# Patient Record
Sex: Male | Born: 1995 | Race: White | Hispanic: No | Marital: Single | Smoking: Never smoker
Health system: Southern US, Community
[De-identification: ages and names within clinical notes are randomized; demographics above are authoritative.]

## PROBLEM LIST (undated history)

## (undated) DIAGNOSIS — S8010XA Contusion of unspecified lower leg, initial encounter: Secondary | ICD-10-CM

## (undated) DIAGNOSIS — K1120 Sialoadenitis, unspecified: Secondary | ICD-10-CM

## (undated) HISTORY — DX: Contusion of unspecified lower leg, initial encounter: S80.10XA

## (undated) HISTORY — DX: Sialoadenitis, unspecified: K11.20

---
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
## 2015-05-31 ENCOUNTER — Encounter: Payer: Self-pay | Admitting: Emergency Medicine

## 2015-05-31 ENCOUNTER — Emergency Department
Admission: EM | Admit: 2015-05-31 | Discharge: 2015-05-31 | Disposition: A | Discharge status: Home / Self Care | Payer: No Typology Code available for payment source | Attending: Emergency Medicine | Admitting: Emergency Medicine

## 2015-05-31 ENCOUNTER — Emergency Department: Payer: No Typology Code available for payment source

## 2015-05-31 DIAGNOSIS — T07XXXA Unspecified multiple injuries, initial encounter: Secondary | ICD-10-CM

## 2015-05-31 DIAGNOSIS — S59902A Unspecified injury of left elbow, initial encounter: Secondary | ICD-10-CM | POA: Insufficient documentation

## 2015-05-31 DIAGNOSIS — Y9389 Activity, other specified: Secondary | ICD-10-CM | POA: Insufficient documentation

## 2015-05-31 DIAGNOSIS — Y998 Other external cause status: Secondary | ICD-10-CM | POA: Diagnosis not present

## 2015-05-31 DIAGNOSIS — S60512A Abrasion of left hand, initial encounter: Secondary | ICD-10-CM | POA: Diagnosis not present

## 2015-05-31 DIAGNOSIS — S80812A Abrasion, left lower leg, initial encounter: Secondary | ICD-10-CM | POA: Insufficient documentation

## 2015-05-31 DIAGNOSIS — Y92410 Unspecified street and highway as the place of occurrence of the external cause: Secondary | ICD-10-CM | POA: Insufficient documentation

## 2015-05-31 DIAGNOSIS — S8392XA Sprain of unspecified site of left knee, initial encounter: Secondary | ICD-10-CM | POA: Insufficient documentation

## 2015-05-31 DIAGNOSIS — S40212A Abrasion of left shoulder, initial encounter: Secondary | ICD-10-CM | POA: Diagnosis not present

## 2015-05-31 DIAGNOSIS — S86912A Strain of unspecified muscle(s) and tendon(s) at lower leg level, left leg, initial encounter: Secondary | ICD-10-CM | POA: Diagnosis not present

## 2015-05-31 DIAGNOSIS — S20412A Abrasion of left back wall of thorax, initial encounter: Secondary | ICD-10-CM | POA: Diagnosis not present

## 2015-05-31 DIAGNOSIS — S79912A Unspecified injury of left hip, initial encounter: Secondary | ICD-10-CM | POA: Insufficient documentation

## 2015-05-31 DIAGNOSIS — S8992XA Unspecified injury of left lower leg, initial encounter: Secondary | ICD-10-CM | POA: Diagnosis present

## 2015-05-31 MED ORDER — ACETAMINOPHEN 500 MG PO TABS
1000.0000 mg | ORAL_TABLET | Freq: Once | ORAL | Status: AC
  Administered 2015-05-31: 1000 mg via ORAL
  Filled 2015-05-31: qty 2

## 2015-05-31 MED ORDER — BACITRACIN ZINC 500 UNIT/GM EX OINT
TOPICAL_OINTMENT | CUTANEOUS | Status: AC
  Administered 2015-05-31: 1 via TOPICAL
  Filled 2015-05-31: qty 0.9

## 2015-05-31 MED ORDER — HYDROCODONE-ACETAMINOPHEN 5-325 MG PO TABS: 1.0000 | ORAL_TABLET | Freq: Four times a day (QID) | ORAL | Status: DC | PRN

## 2015-05-31 MED ORDER — CYCLOBENZAPRINE HCL 5 MG PO TABS: 5.0000 mg | ORAL_TABLET | Freq: Three times a day (TID) | ORAL | Status: DC | PRN

## 2015-05-31 MED ORDER — BACITRACIN ZINC 500 UNIT/GM EX OINT
TOPICAL_OINTMENT | Freq: Two times a day (BID) | CUTANEOUS | Status: DC
  Administered 2015-05-31: 1 via TOPICAL

## 2015-05-31 MED ORDER — BACITRACIN ZINC 500 UNIT/GM EX OINT
TOPICAL_OINTMENT | CUTANEOUS | Status: AC
  Filled 2015-05-31: qty 0.9

## 2015-05-31 MED ORDER — HYDROCODONE-ACETAMINOPHEN 5-325 MG PO TABS
1.0000 | ORAL_TABLET | Freq: Once | ORAL | Status: AC
  Administered 2015-05-31: 1 via ORAL
  Filled 2015-05-31: qty 1
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
## 2015-05-31 NOTE — Discharge Instructions (Signed)
Knee Sprain A knee sprain is a tear in the strong bands of tissue that connect the bones (ligaments) of your knee. HOME CARE  Raise (elevate) your injured knee to lessen puffiness (swelling).  To ease pain and puffiness, put ice on the injured area.  Put ice in a plastic bag.  Place a towel between your skin and the bag.  Leave the ice on for 20 minutes, 2-3 times a day.  Only take medicine as told by your doctor.  Do not leave your knee unprotected until pain and stiffness go away (usually 4-6 weeks).  If you have a cast or splint, do not get it wet. If your doctor told you to not take it off, cover it with a plastic bag when you shower or bathe. Do not swim.  Your doctor may have you do exercises to prevent or limit permanent weakness and stiffness. GET HELP RIGHT AWAY IF:   Your cast or splint becomes damaged.  Your pain gets worse.  You have a lot of pain, puffiness, or numbness below the cast or splint. MAKE SURE YOU:   Understand these instructions.  Will watch your condition.  Will get help right away if you are not doing well or get worse.   This information is not intended to replace advice given to you by your health care provider. Make sure you discuss any questions you have with your health care provider.   Document Released: 06/25/2009 Document Revised: 07/12/2013 Document Reviewed: 03/15/2013 Elsevier Interactive Patient Education 2016 [place] the wounds clean, dry, and covered. Take the prescription meds as directed. Follow-up with Dr. Army Melia as needed.

## 2015-06-04 ENCOUNTER — Encounter: Payer: Self-pay | Admitting: Internal Medicine

## 2015-06-05 ENCOUNTER — Encounter: Payer: Self-pay | Admitting: Internal Medicine

## 2015-06-05 ENCOUNTER — Ambulatory Visit (INDEPENDENT_AMBULATORY_CARE_PROVIDER_SITE_OTHER): Payer: 59 | Admitting: Internal Medicine

## 2015-06-05 VITALS — BP 100/60 | HR 68 | Ht 72.0 in | Wt 123.6 lb

## 2015-06-05 DIAGNOSIS — M8480 Other disorders of continuity of bone, unspecified site: Secondary | ICD-10-CM

## 2015-06-05 DIAGNOSIS — S8012XA Contusion of left lower leg, initial encounter: Secondary | ICD-10-CM

## 2015-06-05 DIAGNOSIS — S8010XA Contusion of unspecified lower leg, initial encounter: Secondary | ICD-10-CM | POA: Insufficient documentation

## 2015-06-05 DIAGNOSIS — D169 Benign neoplasm of bone and articular cartilage, unspecified: Secondary | ICD-10-CM

## 2015-06-05 HISTORY — DX: Contusion of unspecified lower leg, initial encounter: S80.10XA
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
## 2015-07-11 ENCOUNTER — Ambulatory Visit (INDEPENDENT_AMBULATORY_CARE_PROVIDER_SITE_OTHER): Payer: 59 | Admitting: Internal Medicine

## 2015-07-11 ENCOUNTER — Encounter: Payer: Self-pay | Admitting: Internal Medicine

## 2015-07-11 VITALS — BP 110/70 | HR 68 | Ht 72.0 in | Wt 124.4 lb

## 2015-07-11 DIAGNOSIS — L03012 Cellulitis of left finger: Secondary | ICD-10-CM | POA: Diagnosis not present

## 2015-07-11 MED ORDER — AMOXICILLIN-POT CLAVULANATE 875-125 MG PO TABS: 1.0000 | ORAL_TABLET | Freq: Two times a day (BID) | ORAL | Status: DC

## 2015-07-11 NOTE — Progress Notes (Signed)
    Date:  07/11/2015   Name:  Andrew Frederick.   DOB:  1996-06-01   MRN:  [id number]   Chief Complaint: Abrasion  3 weeks ago noted swelling around this index finger on the left hand.  He thought there might be a splinter so his mother tried to see if she could remove anything with tweezers.  No foreign body was found.  Thereafter some purulent material drained from the region but it has not healed.  Slightly tender but he can work and is not limited in his activities.   Review of Systems  Constitutional: Negative for fever.  Skin: Positive for wound.  Neurological: Negative for weakness and numbness.  Hematological: Does not bruise/bleed easily.      Patient Active Problem List   Diagnosis Date Noted  . 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 Multiple leg contusions 06/05/2015    Prior to Admission medications   Not on File    No Known Allergies  No past surgical history on file.  Social History  Substance Use Topics  . Smoking status: Never Smoker   . Smokeless tobacco: None  . Alcohol Use: No    Medication list has been reviewed and updated.   Physical Exam  Constitutional: He is oriented to person, place, and time. He appears well-developed. No distress.  HENT:  Head: Normocephalic and atraumatic.  Eyes: Conjunctivae are normal. Right eye exhibits no discharge. Left eye exhibits no discharge. No scleral icterus.  Pulmonary/Chest: Effort normal. No respiratory distress.  Musculoskeletal: Normal range of motion.  Neurological: He is alert and oriented to person, place, and time.  Skin: Skin is warm and dry. No rash noted.   Paronychia of the left index finger -  No drainage or odor but the area is red , slightly swollen, and minimally tender.  The nailbed is lifted from the base of the nail.  Psychiatric: He has a normal mood and affect. His behavior is normal. Thought content normal.    BP 110/70 mmHg  Pulse 68  Ht 6' (1.829 m)  Wt 124 lb 6.4 oz (56.427 kg)  BMI  16.87 kg/m2  Assessment and Plan: 1. Paronychia of finger, left Warm soaks 2-3 times a day Apply Neosporin and a loose dressing or bandaid - amoxicillin-clavulanate (AUGMENTIN) 875-125 MG tablet; Take 1 tablet by mouth 2 (two) times daily.  Dispense: 20 tablet; Refill: 0   Halina Maidens, MD [facility]  07/11/2015

## 2015-07-11 NOTE — Patient Instructions (Signed)
Paronychia °Paronychia is an infection of the skin that surrounds a nail. It usually affects the skin around a fingernail, but it may also occur near a toenail. It often causes pain and swelling around the nail. This condition may come on suddenly or develop over a longer period. In some cases, a collection of pus (abscess) can form near or under the nail. Usually, paronychia is not serious and it clears up with treatment. °CAUSES °This condition may be caused by bacteria or fungi. It is commonly caused by either Streptococcus or Staphylococcus bacteria. The bacteria or fungi often cause the infection by getting into the affected area through an opening in the skin, such as a cut or a hangnail. °RISK FACTORS °This condition is more likely to develop in: °· People who get their hands wet often, such as those who work as dishwashers, bartenders, or nurses. °· People who bite their fingernails or suck their thumbs. °· People who trim their nails too short. °· People who have hangnails or injured fingertips. °· People who get manicures. °· People who have diabetes. °SYMPTOMS °Symptoms of this condition include: °· Redness and swelling of the skin near the nail. °· Tenderness around the nail when you touch the area. °· Pus-filled bumps under the cuticle. The cuticle is the skin at the base or sides of the nail. °· Fluid or pus under the nail. °· Throbbing pain in the area. °DIAGNOSIS °This condition is usually diagnosed with a physical exam. In some cases, a sample of pus may be taken from an abscess to be tested in a lab. This can help to determine what type of bacteria or fungi is causing the condition. °TREATMENT °Treatment for this condition depends on the cause and severity of the condition. If the condition is mild, it may clear up on its own in a few days. Your health care provider may recommend soaking the affected area in warm water a few times a day. When treatment is needed, the options may  include: °· Antibiotic medicine, if the condition is caused by a bacterial infection. °· Antifungal medicine, if the condition is caused by a fungal infection. °· Incision and drainage, if an abscess is present. In this procedure, the health care provider will cut open the abscess so the pus can drain out. °HOME CARE INSTRUCTIONS °· Soak the affected area in warm water if directed to do so by your health care provider. You may be told to do this for 20 minutes, 2-3 times a day. Keep the area dry in between soakings. °· Take medicines only as directed by your health care provider. °· If you were prescribed an antibiotic medicine, finish all of it even if you start to feel better. °· Keep the affected area clean. °· Do not try to drain a fluid-filled bump yourself. °· If you will be washing dishes or performing other tasks that require your hands to get wet, wear rubber gloves. You should also wear gloves if your hands might come in contact with irritating substances, such as cleaners or chemicals. °· Follow your health care provider's instructions about: °¨ Wound care. °¨ Bandage (dressing) changes and removal. °SEEK MEDICAL CARE IF: °· Your symptoms get worse or do not improve with treatment. °· You have a fever or chills. °· You have redness spreading from the affected area. °· You have continued or increased fluid, blood, or pus coming from the affected area. °· Your finger or knuckle becomes swollen or is difficult to move. °  °  This information is not intended to replace advice given to you by your health care provider. Make sure you discuss any questions you have with your health care provider. °  °Document Released: 12/31/2000 Document Revised: 11/21/2014 Document Reviewed: 06/14/2014 °Elsevier Interactive Patient Education ©2016 [employer] ° °

## 2015-09-05 ENCOUNTER — Ambulatory Visit: Payer: 59 | Admitting: Internal Medicine
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
## 2017-02-09 DIAGNOSIS — M25572 Pain in left ankle and joints of left foot: Secondary | ICD-10-CM | POA: Diagnosis not present

## 2017-06-04 ENCOUNTER — Ambulatory Visit: Payer: 59 | Admitting: Internal Medicine

## 2017-06-04 ENCOUNTER — Encounter: Payer: Self-pay | Admitting: Internal Medicine

## 2017-06-04 VITALS — BP 120/64 | HR 69 | Temp 98.3°F | Ht 72.0 in | Wt 126.0 lb

## 2017-06-04 DIAGNOSIS — G44209 Tension-type headache, unspecified, not intractable: Secondary | ICD-10-CM

## 2017-06-04 DIAGNOSIS — K1120 Sialoadenitis, unspecified: Secondary | ICD-10-CM

## 2017-06-04 DIAGNOSIS — M26609 Unspecified temporomandibular joint disorder, unspecified side: Secondary | ICD-10-CM

## 2017-06-04 HISTORY — DX: Sialoadenitis, unspecified: K11.20

## 2017-06-04 MED ORDER — CYCLOBENZAPRINE HCL 10 MG PO TABS: 10.0000 mg | ORAL_TABLET | Freq: Every day | ORAL | 0 refills | Status: DC

## 2017-06-04 MED ORDER — AMOXICILLIN-POT CLAVULANATE 875-125 MG PO TABS: 1.0000 | ORAL_TABLET | Freq: Two times a day (BID) | ORAL | 0 refills | Status: AC

## 2017-06-04 NOTE — Progress Notes (Signed)
Date:  06/04/2017   Name:  Andrew Frederick.   DOB:  [birth year]   MRN:  [id number]   Chief Complaint: Headache (Started happening 2 months ago. They come and go and can last a full month with no relief. Pain is located above eye brows.  ) and Facial Swelling (Started this morning- Jaw is swollen and unsure of why. Not really sore just very swollen. )  Headache   This is a new problem. The current episode started more than 1 month ago. The problem occurs daily. The problem has been unchanged. The pain is located in the frontal region. The pain does not radiate. The quality of the pain is described as aching. Pertinent negatives include no ear pain, fever, nausea, phonophobia, photophobia, sinus pressure, sore throat, visual change, vomiting or weakness. Exacerbated by: work dusty environment. He has tried NSAIDs for the symptoms. The treatment provided significant (but headaches return) relief.   Swelling - noted on left jaw this am.  No trauma or fever.  No sore throat or obvious tooth problem.  He has not been to the dentist in years.  He has no ear pain, sinus drainage, trouble swallowing.  Mouth feels dry on that side.   Review of Systems  Constitutional: Negative for chills, fatigue, fever and unexpected weight change.  HENT: Positive for facial swelling. Negative for ear pain, sinus pressure, sinus pain, sore throat, trouble swallowing and voice change.   Eyes: Negative for photophobia.  Respiratory: Negative for chest tightness, shortness of breath and wheezing.   Cardiovascular: Negative for chest pain, palpitations and leg swelling.  Gastrointestinal: Negative for diarrhea, nausea and vomiting.  Musculoskeletal: Negative for arthralgias and gait problem.  Neurological: Positive for headaches. Negative for weakness.  Psychiatric/Behavioral: Negative for sleep disturbance.    Patient Active Problem List   Diagnosis Date Noted  . 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 Multiple leg  contusions 06/05/2015    Prior to Admission medications   Not on File    No Known Allergies  History reviewed. No pertinent surgical history.  Social History   Tobacco Use  . Smoking status: Never Smoker  . Smokeless tobacco: Never Used  Substance Use Topics  . Alcohol use: No    Alcohol/week: 0.0 oz  . Drug use: No     Medication list has been reviewed and updated.  PHQ 2/9 Scores 06/04/2017  PHQ - 2 Score 0    Physical Exam  Constitutional: He is oriented to person, place, and time. He appears well-developed. No distress.  HENT:  Head: Normocephalic and atraumatic.  Right Ear: Tympanic membrane and ear canal normal.  Left Ear: Tympanic membrane and ear canal normal.  Nose: Right sinus exhibits no maxillary sinus tenderness and no frontal sinus tenderness. Left sinus exhibits no maxillary sinus tenderness and no frontal sinus tenderness.  Mouth/Throat: Abnormal dentition. Dental caries present. No posterior oropharyngeal edema or posterior oropharyngeal erythema.  Neck: Normal range of motion. No thyromegaly present.  Cardiovascular: Normal rate, regular rhythm and normal heart sounds.  Pulmonary/Chest: Effort normal and breath sounds normal. No respiratory distress.  Musculoskeletal: Normal range of motion.  TMJ click with decreased excursion bilaterally  Lymphadenopathy:    He has no cervical adenopathy.  Neurological: He is alert and oriented to person, place, and time.  Skin: Skin is warm and dry. No rash noted.  Soft tissue swelling over the left lower jaw.  Soft, poorly defined, minimally tender, not red or warm  Oral  mucosa clear No saliva expressed from palpation  Psychiatric: He has a normal mood and affect. His behavior is normal. Thought content normal.  Nursing note and vitals reviewed.   BP 120/64   Pulse 69   Temp 98.3 F (36.8 C) (Oral)   Ht 6' (1.829 m)   Wt 126 lb (57.2 kg)   SpO2 98%   BMI 17.09 kg/m   Assessment and Plan: 1. Muscle  tension headache Continue advil as needed - cyclobenzaprine (FLEXERIL) 10 MG tablet; Take 1 tablet (10 mg total) at bedtime by mouth.  Dispense: 30 tablet; Refill: 0  2. TMJ (temporomandibular joint disorder) Likely the source of headache  3. Parotiditis Sour lemon drops to help express possible stone Follow up next week if not improving - amoxicillin-clavulanate (AUGMENTIN) 875-125 MG tablet; Take 1 tablet 2 (two) times daily for 10 days by mouth.  Dispense: 20 tablet; Refill: 0   Meds ordered this encounter  Medications  . amoxicillin-clavulanate (AUGMENTIN) 875-125 MG tablet    Sig: Take 1 tablet 2 (two) times daily for 10 days by mouth.    Dispense:  20 tablet    Refill:  0  . cyclobenzaprine (FLEXERIL) 10 MG tablet    Sig: Take 1 tablet (10 mg total) at bedtime by mouth.    Dispense:  30 tablet    Refill:  0   Pt recommended to see a dentist for routine oral care.  Partially dictated using Editor, commissioning. Any errors are unintentional.  Halina Maidens, MD [facility]  06/04/2017

## 2017-06-04 NOTE — Patient Instructions (Signed)
Claritin 10 mg once a day  Lemon drops -

## 2017-10-29 ENCOUNTER — Other Ambulatory Visit: Payer: Self-pay

## 2017-10-29 ENCOUNTER — Inpatient Hospital Stay
Admission: AD | Admit: 2017-10-29 | Discharge: 2017-11-03 | DRG: 885 | Disposition: A | Discharge status: Home / Self Care | Payer: 59 | Attending: Psychiatry | Admitting: Psychiatry

## 2017-10-29 ENCOUNTER — Encounter: Payer: Self-pay | Admitting: Emergency Medicine

## 2017-10-29 ENCOUNTER — Emergency Department
Admission: EM | Admit: 2017-10-29 | Discharge: 2017-10-29 | Disposition: A | Discharge status: Other Acute Inpatient Hospital | Payer: 59 | Attending: Emergency Medicine | Admitting: Emergency Medicine

## 2017-10-29 DIAGNOSIS — Z634 Disappearance and death of family member: Secondary | ICD-10-CM | POA: Diagnosis not present

## 2017-10-29 DIAGNOSIS — F332 Major depressive disorder, recurrent severe without psychotic features: Secondary | ICD-10-CM | POA: Diagnosis present

## 2017-10-29 DIAGNOSIS — R45851 Suicidal ideations: Secondary | ICD-10-CM | POA: Diagnosis present

## 2017-10-29 DIAGNOSIS — G4700 Insomnia, unspecified: Secondary | ICD-10-CM | POA: Diagnosis present

## 2017-10-29 DIAGNOSIS — Z5181 Encounter for therapeutic drug level monitoring: Secondary | ICD-10-CM | POA: Diagnosis not present

## 2017-10-29 DIAGNOSIS — Z79899 Other long term (current) drug therapy: Secondary | ICD-10-CM

## 2017-10-29 LAB — COMPREHENSIVE METABOLIC PANEL
ALBUMIN: 5 g/dL (ref 3.5–5.0)
ALT: 17 U/L (ref 17–63)
ANION GAP: 6 (ref 5–15)
AST: 24 U/L (ref 15–41)
Alkaline Phosphatase: 118 U/L (ref 38–126)
BUN: 14 mg/dL (ref 6–20)
CHLORIDE: 107 mmol/L (ref 101–111)
CO2: 29 mmol/L (ref 22–32)
Calcium: 9.6 mg/dL (ref 8.9–10.3)
Creatinine, Ser: 1.01 mg/dL (ref 0.61–1.24)
GFR calc Af Amer: >60 mL/min (ref >60)
GFR calc non Af Amer: >60 mL/min (ref >60)
GLUCOSE: 86 mg/dL (ref 65–99)
POTASSIUM: 3.9 mmol/L (ref 3.5–5.1)
SODIUM: 142 mmol/L (ref 135–145)
TOTAL PROTEIN: 8.4 g/dL — AB (ref 6.5–8.1)
Total Bilirubin: 0.6 mg/dL (ref 0.3–1.2)

## 2017-10-29 LAB — CBC
HEMATOCRIT: 44.7 % (ref 40.0–52.0)
Hemoglobin: 15.4 g/dL (ref 13.0–18.0)
MCH: 29.8 pg (ref 26.0–34.0)
MCHC: 34.4 g/dL (ref 32.0–36.0)
MCV: 86.5 fL (ref 80.0–100.0)
Platelets: 260 10*3/uL (ref 150–440)
RBC: 5.17 MIL/uL (ref 4.40–5.90)
RDW: 13.6 % (ref 11.5–14.5)
WBC: 5.2 10*3/uL (ref 3.8–10.6)

## 2017-10-29 LAB — URINE DRUG SCREEN, QUALITATIVE (ARMC ONLY)
AMPHETAMINES, UR SCREEN: NOT DETECTED
BENZODIAZEPINE, UR SCRN: NOT DETECTED
Barbiturates, Ur Screen: NOT DETECTED
Cannabinoid 50 Ng, Ur ~~LOC~~: NOT DETECTED
Cocaine Metabolite,Ur ~~LOC~~: NOT DETECTED
MDMA (ECSTASY) UR SCREEN: NOT DETECTED
METHADONE SCREEN, URINE: NOT DETECTED
OPIATE, UR SCREEN: NOT DETECTED
Phencyclidine (PCP) Ur S: NOT DETECTED
Tricyclic, Ur Screen: NOT DETECTED

## 2017-10-29 LAB — ACETAMINOPHEN LEVEL

## 2017-10-29 LAB — ETHANOL: Alcohol, Ethyl (B): <10 mg/dL (ref <10)

## 2017-10-29 LAB — SALICYLATE LEVEL: Salicylate Lvl: <7 mg/dL (ref 2.8–30.0)

## 2017-10-29 MED ORDER — ALUM & MAG HYDROXIDE-SIMETH 200-200-20 MG/5ML PO SUSP: 30.0000 mL | ORAL | Status: DC | PRN

## 2017-10-29 MED ORDER — ACETAMINOPHEN 325 MG PO TABS: 650.0000 mg | ORAL_TABLET | Freq: Four times a day (QID) | ORAL | Status: DC | PRN

## 2017-10-29 MED ORDER — TRAZODONE HCL 100 MG PO TABS
100.0000 mg | ORAL_TABLET | Freq: Every evening | ORAL | Status: DC | PRN
  Administered 2017-10-29 – 2017-11-02 (×2): 100 mg via ORAL
  Filled 2017-10-29 (×2): qty 1

## 2017-10-29 MED ORDER — HYDROXYZINE HCL 50 MG PO TABS
50.0000 mg | ORAL_TABLET | Freq: Three times a day (TID) | ORAL | Status: DC | PRN
  Administered 2017-10-29 – 2017-11-02 (×2): 50 mg via ORAL
  Filled 2017-10-29 (×2): qty 1

## 2017-10-29 MED ORDER — FLUOXETINE HCL 20 MG PO CAPS
20.0000 mg | ORAL_CAPSULE | Freq: Every day | ORAL | Status: DC
  Administered 2017-10-30 – 2017-11-03 (×5): 20 mg via ORAL
  Filled 2017-10-29: qty 2
  Filled 2017-10-29 (×5): qty 1

## 2017-10-29 MED ORDER — MAGNESIUM HYDROXIDE 400 MG/5ML PO SUSP: 30.0000 mL | Freq: Every day | ORAL | Status: DC | PRN

## 2017-10-29 MED ORDER — FLUOXETINE HCL 20 MG PO CAPS
20.0000 mg | ORAL_CAPSULE | Freq: Every day | ORAL | Status: DC
  Administered 2017-10-29: 20 mg via ORAL
  Filled 2017-10-29: qty 1

## 2017-10-29 NOTE — ED Notes (Signed)
Dinner tray given to patient at this time.

## 2017-10-29 NOTE — BH Assessment (Signed)
Patient is to be admitted to [facility] by Dr. Weber Cooks.  Attending Physician will be Dr. Wonda Olds.   Patient has been assigned to room 312, by [place] staff is aware of the admission:  Luann, ER Sectary   Dr. Mariea Clonts, ER MD   Amy M., Patient's Nurse   Genella Rife, Patient Access.

## 2017-10-29 NOTE — ED Notes (Signed)
Patient's father here at bedside with patient for visitation.  Both parties calm and cooperative at this time.  Patient advocate present with visitor.

## 2017-10-29 NOTE — ED Notes (Signed)
Visitors changed out for patient.  Visitor is calm and cooperative and given instructions for visitation privileges.

## 2017-10-29 NOTE — ED Notes (Signed)
Father took belongings home per patient.

## 2017-10-29 NOTE — Consult Note (Signed)
[facility] Consult   Reason for Consult: Consult for 22 year old man who came voluntarily to the emergency room seeking help with suicidal ideation Referring Physician: Mariea Clonts Patient Identification: Andrew Frederick. MRN:  [id number] Principal Diagnosis: Severe recurrent major depression without psychotic features [facility]) Diagnosis:   Patient Active Problem List   Diagnosis Date Noted  . Suicidal ideation [R45.851] 10/29/2017  . Severe recurrent major depression without psychotic features ([place]) [F33.2] 10/29/2017  . Muscle tension headache [G44.209] 06/04/2017  . 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 Parotiditis [K11.20] 06/04/2017  . 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 Multiple leg contusions [S80.10XA] 06/05/2015    Total Time spent with patient: 1 hour  Subjective:   Andrew Frederick. is a 22 y.o. male patient admitted with "I lost my fianc".  HPI: Patient interviewed chart reviewed.  22 year old man came to the emergency room voluntarily because he says his mood has been bad and he has been having "bad thoughts".  His fiance died a little over a year ago.  He says he has been feeling bad ever since then but the current episode has been going on at least a week.  Mood feels down all the time.  He says he is having thoughts about killing himself.  When asked about any plans he will mention overdosing although he does not have any medicine he would overdose on.  Patient says he has difficulty sleeping at night waking up very early.  He also says that when he closes his eyes he will see visions of demons which sometimes persist when he opens his eyes.  Appetite is been a little decreased.  He has still been able to go and do his job though.  He denies any alcohol or drug abuse.  He does not note any specific new stressor or reason why symptoms are worse.  Patient is not currently receiving any mental health treatment.  Social history: Lives with  his parents.  Works at a warehouse daytime shift.  Says that when he is not working he comes home and just goes into his bedroom and has very little social life.  He does not offer much detail about the situation with his fiance dying.  Medical history: Patient has been treated for headaches and for minor injuries in the past but has no known ongoing medical problems.  Substance abuse history: Denies any alcohol or drug abuse.  Nothing on the drug screen.  Past Psychiatric History: Patient says he has never been seen by a mental health provider in the past despite saying the symptoms have been present for over a year.  Never seen a counselor or therapist.  Never been in a psychiatric hospital.  Never been on any psychiatric medicine.  No history of suicide attempts in the past.  Risk to Self: Is patient at risk for suicide?: Yes Risk to Others:   Prior Inpatient Therapy:   Prior Outpatient Therapy:    Past Medical History: History reviewed. No pertinent past medical history. History reviewed. No pertinent surgical history. Family History:  Family History  Problem Relation Age of Onset  . Diabetes Father   . Hyperlipidemia Father    Family Psychiatric  History: Does not know of family history Social History:  Social History   Substance and Sexual Activity  Alcohol Use No  . Alcohol/week: 0.0 oz     Social History   Substance and Sexual Activity  Drug Use No    Social History  Socioeconomic History  . Marital status: Single    Spouse name: Not on file  . Number of children: Not on file  . Years of education: Not on file  . Highest education level: Not on file  Occupational History  . Not on file  Social Needs  . Financial resource strain: Not on file  . Food insecurity:    Worry: Not on file    Inability: Not on file  . Transportation needs:    Medical: Not on file    Non-medical: Not on file  Tobacco Use  . Smoking status: Never Smoker  . Smokeless tobacco: Never  Used  Substance and Sexual Activity  . Alcohol use: No    Alcohol/week: 0.0 oz  . Drug use: No  . Sexual activity: Not on file  Lifestyle  . Physical activity:    Days per week: Not on file    Minutes per session: Not on file  . Stress: Not on file  Relationships  . Social connections:    Talks on phone: Not on file    Gets together: Not on file    Attends religious service: Not on file    Active member of club or organization: Not on file    Attends meetings of clubs or organizations: Not on file    Relationship status: Not on file  Other Topics Concern  . Not on file  Social History Narrative  . Not on file   Additional Social History:    Allergies:  No Known Allergies  Labs:  Results for orders placed or performed during the hospital encounter of 10/29/17 (from the past 48 hour(s))  Comprehensive metabolic panel     Status: Abnormal   Collection Time: 10/29/17  9:53 AM  Result Value Ref Range   Sodium 142 135 - 145 mmol/L   Potassium 3.9 3.5 - 5.1 mmol/L   Chloride 107 101 - 111 mmol/L   CO2 29 22 - 32 mmol/L   Glucose, Bld 86 65 - 99 mg/dL   BUN 14 6 - 20 mg/dL   Creatinine, Ser 1.01 0.61 - 1.24 mg/dL   Calcium 9.6 8.9 - 10.3 mg/dL   Total Protein 8.4 (H) 6.5 - 8.1 g/dL   Albumin 5.0 3.5 - 5.0 g/dL   AST 24 15 - 41 U/L   ALT 17 17 - 63 U/L   Alkaline Phosphatase 118 38 - 126 U/L   Total Bilirubin 0.6 0.3 - 1.2 mg/dL   GFR calc non Af Amer >60 >60 mL/min   GFR calc Af Amer >60 >60 mL/min    Comment: (NOTE) The eGFR has been calculated using the CKD EPI equation. This calculation has not been validated in all clinical situations. eGFR's persistently <60 mL/min signify possible Chronic Kidney Disease.    Anion gap 6 5 - 15    Comment: Performed at [facility], [address] [place], [place] [address]  Ethanol     Status: None   Collection Time: 10/29/17  9:53 AM  Result Value Ref Range   Alcohol, Ethyl (B) <10 <10 mg/dL    Comment:         LOWEST DETECTABLE LIMIT FOR SERUM ALCOHOL IS 10 mg/dL FOR MEDICAL PURPOSES ONLY Performed at [facility], [address] [place], [place] [address]   Salicylate level     Status: None   Collection Time: 10/29/17  9:53 AM  Result Value Ref Range   Salicylate Lvl <8.6 2.8 - 30.0 mg/dL  Comment: Performed at [facility], [address] [place], [place] [address]  Acetaminophen level     Status: Abnormal   Collection Time: 10/29/17  9:53 AM  Result Value Ref Range   Acetaminophen (Tylenol), Serum <10 (L) 10 - 30 ug/mL    Comment:        THERAPEUTIC CONCENTRATIONS VARY SIGNIFICANTLY. A RANGE OF 10-30 ug/mL MAY BE AN EFFECTIVE CONCENTRATION FOR MANY PATIENTS. HOWEVER, SOME ARE BEST TREATED AT CONCENTRATIONS OUTSIDE THIS RANGE. ACETAMINOPHEN CONCENTRATIONS >150 ug/mL AT 4 HOURS AFTER INGESTION AND >50 ug/mL AT 12 HOURS AFTER INGESTION ARE OFTEN ASSOCIATED WITH TOXIC REACTIONS. Performed at [facility], [address] [place], [place] [address]   cbc     Status: None   Collection Time: 10/29/17  9:53 AM  Result Value Ref Range   WBC 5.2 3.8 - 10.6 K/uL   RBC 5.17 4.40 - 5.90 MIL/uL   Hemoglobin 15.4 13.0 - 18.0 g/dL   HCT 44.7 40.0 - 52.0 %   MCV 86.5 80.0 - 100.0 fL   MCH 29.8 26.0 - 34.0 pg   MCHC 34.4 32.0 - 36.0 g/dL   RDW 13.6 11.5 - 14.5 %   Platelets 260 150 - 440 K/uL    Comment: Performed at [facility], [address] [place], [place] [address]    No current facility-administered medications for this encounter.    Current Outpatient Medications  Medication Sig Dispense Refill  . cyclobenzaprine (FLEXERIL) 10 MG tablet Take 1 tablet (10 mg total) at bedtime by mouth. 30 tablet 0    Musculoskeletal: Strength & Muscle Tone: within normal limits Gait & Station: normal Patient leans: N/A  Psychiatric Specialty Exam: Physical Exam  Nursing note and vitals reviewed. Constitutional: He appears well-developed and  well-nourished.  HENT:  Head: Normocephalic and atraumatic.  Eyes: Pupils are equal, round, and reactive to light. Conjunctivae are normal.  Neck: Normal range of motion.  Cardiovascular: Regular rhythm and normal heart sounds.  Respiratory: Effort normal. No respiratory distress.  GI: Soft.  Musculoskeletal: Normal range of motion.  Neurological: He is alert.  Skin: Skin is warm and dry.  Psychiatric: His affect is blunt. His speech is delayed. He is slowed and withdrawn. Cognition and memory are normal. He expresses impulsivity. He exhibits a depressed mood. He expresses suicidal ideation. He expresses suicidal plans.    Review of Systems  Constitutional: Negative.   HENT: Negative.   Eyes: Negative.   Respiratory: Negative.   Cardiovascular: Negative.   Gastrointestinal: Negative.   Musculoskeletal: Negative.   Skin: Negative.   Neurological: Negative.   Psychiatric/Behavioral: Positive for depression, hallucinations and suicidal ideas. Negative for memory loss and substance abuse. The patient is nervous/anxious and has insomnia.     Blood pressure 136/79, pulse 82, temperature 98.4 F (36.9 C), temperature source Oral, resp. rate 14, height 6' (1.829 m), weight 65.8 kg (145 lb), SpO2 100 %.Body mass index is 19.67 kg/m.  General Appearance: Casual  Eye Contact:  Good  Speech:  Slow  Volume:  Decreased  Mood:  Depressed  Affect:  Blunt and Constricted  Thought Process:  Coherent  Orientation:  Full (Time, Place, and Person)  Thought Content:  Hallucinations: Visual  Suicidal Thoughts:  Yes.  with intent/plan  Homicidal Thoughts:  No  Memory:  Immediate;   Fair Recent;   Fair Remote;   Fair  Judgement:  Fair  Insight:  Fair  Psychomotor Activity:  Decreased  Concentration:  Concentration: Fair  Recall:  Smiley Houseman of Knowledge:  Fair  Language:  Fair  Akathisia:  No  Handed:  Right  AIMS (if indicated):     Assets:  Desire for Improvement Financial  Resources/Insurance Housing Physical Health Social Support  ADL's:  Intact  Cognition:  Impaired,  Mild  Sleep:        Treatment Plan Summary: Medication management and Plan 22 year old man presents with complaints of multiple symptoms of depression including suicidal ideation.  Evidently this is been going on for quite a while but this is the first time it drew the attention of his family to bring him to the hospital.  The patient presents as neatly groomed but extremely passive and flat.  He answers questions but usually provides no extra information.  Cognitive testing is intact however.  Patient meets criteria for inpatient hospitalization.  He is agreeable to the plan.  Full set of labs will be done.  We can go ahead and start an order for antidepressant medicine.  PRN medicines for sleep and anxiety will be available.  He will continue 15-minute checks.  Case reviewed with TTS and emergency room physician.  Disposition: Recommend psychiatric Inpatient admission when medically cleared. Supportive therapy provided about ongoing stressors.  Alethia Berthold, MD 10/29/2017 12:43 PM

## 2017-10-29 NOTE — ED Triage Notes (Signed)
Pt here for SI with plan of overdose.  Lost fiance about 1.5 years ago and dad reports pt goes through episodes like these every few months.  No psych med or diagnosis. Has not gotten any treatment or evaluation in past for symptoms.    Dressed out by this Therapist, sports and tommy NT.  All belongings given to father with pt permission. Father is Careers information officer.

## 2017-10-29 NOTE — ED Notes (Signed)
Patient calm and cooperative.Denies SI,HI and AVH.Mother visited.Compliant with medications.No issues verbalized at this time.

## 2017-10-29 NOTE — ED Notes (Signed)
Report called to [facility] in [place]. Patient agreeable with plan of care.

## 2017-10-29 NOTE — BH Assessment (Signed)
Assessment Note  Andrew Camps. is an 22 y.o. male who presents to the ER via his father due to increase symptoms of depression and suicidal ideations. Patient states, he has dealt with depression for approximately "a year and half ago." The sudden and unexpected death of his fianc, is the cause of the depression. He states they were together for approximately three years. His sleep has decreased; he's sleeping four to five hours a night. He is having trouble staying asleep. His appetite has decreased and the amount and frequency has declined. He also states, he is now contemplating the thought of overdosing on medications to end his life. Prior to his fianc death, he's had no mental health treatment or concerns.  During the interview, the patient was calm, cooperative and pleasant. He was able to provide appropriate answers to the questions. At times, he became tearful. He denies having a history of violence/aggression and no involvement with the legal system.  Patient also denies the use of mind-altering substances. UDS hadn't resulted during the time of this assessment to confirm it.  Patient denies HI and AV/H.  Diagnosis: Depression  Past Medical History: History reviewed. No pertinent past medical history.  History reviewed. No pertinent surgical history.  Family History:  Family History  Problem Relation Age of Onset  . Diabetes Father   . Hyperlipidemia Father     Social History:  reports that he has never smoked. He has never used smokeless tobacco. He reports that he does not drink alcohol or use drugs.  Additional Social History:  Alcohol / Drug Use Pain Medications: See PTA Prescriptions: See PTA Over the Counter: See PTA History of alcohol / drug use?: No history of alcohol / drug abuse Longest period of sobriety (when/how long): n/a Negative Consequences of Use: (n/a) Withdrawal Symptoms: (n/a)  CIWA: CIWA-Ar BP: 136/79 Pulse Rate: 82 COWS:    Allergies: No  Known Allergies  Home Medications:  (Not in a hospital admission)  OB/GYN Status:  No LMP for male patient.  General Assessment Data Location of Assessment: [facility] ED TTS Assessment: In system Is this a Tele or Face-to-Face Assessment?: Face-to-Face Is this an Initial Assessment or a Re-assessment for this encounter?: Initial Assessment Marital status: Single Maiden name: n/a Is patient pregnant?: No Living Arrangements: Parent, Non-relatives/Friends(and sister lives in the same home) Can pt return to current living arrangement?: Yes Admission Status: Involuntary Is patient capable of signing voluntary admission?: No(Under IVC) Referral Source: Self/Family/Friend Insurance type: UHC  Medical Screening Exam (Rachel) Medical Exam completed: Yes  Crisis Care Plan Living Arrangements: Parent, Non-relatives/Friends(and sister lives in the same home) Legal Guardian: Other:(Self) Name of Psychiatrist: Reports of none Name of Therapist: Reports of none  Education Status Is patient currently in school?: No Is the patient employed, unemployed or receiving disability?: Employed  Risk to self with the past 6 months Suicidal Ideation: Yes-Currently Present Has patient been a risk to self within the past 6 months prior to admission? : Yes Suicidal Intent: No Has patient had any suicidal intent within the past 6 months prior to admission? : No Is patient at risk for suicide?: Yes Suicidal Plan?: Yes-Currently Present Has patient had any suicidal plan within the past 6 months prior to admission? : Yes Specify Current Suicidal Plan: Overdose on medication Access to Means: Yes Specify Access to Suicidal Means: Have medications What has been your use of drugs/alcohol within the last 12 months?: Reports of none Previous Attempts/Gestures: No How many  times?: 0 Other Self Harm Risks: Reports of none Triggers for Past Attempts: None known Intentional Self Injurious Behavior:  None Family Suicide History: Unknown Recent stressful life event(s): Other (Comment), Loss (Comment)(Fianc passed) Persecutory voices/beliefs?: No Depression: Yes Depression Symptoms: Insomnia, Tearfulness, Isolating, Fatigue, Guilt, Loss of interest in usual pleasures, Feeling worthless/self pity Substance abuse history and/or treatment for substance abuse?: No Suicide prevention information given to non-admitted patients: Not applicable  Risk to Others within the past 6 months Homicidal Ideation: No Does patient have any lifetime risk of violence toward others beyond the six months prior to admission? : No Thoughts of Harm to Others: No Current Homicidal Intent: No Current Homicidal Plan: No Access to Homicidal Means: No Identified Victim: Reports of none History of harm to others?: No Assessment of Violence: None Noted Violent Behavior Description: Reports of none Does patient have access to weapons?: No Criminal Charges Pending?: No Does patient have a court date: No Is patient on probation?: No  Psychosis Hallucinations: None noted Delusions: None noted  Mental Status Report Appearance/Hygiene: Unremarkable, In scrubs Eye Contact: Poor Motor Activity: Freedom of movement, Unremarkable Speech: Logical/coherent, Unremarkable Level of Consciousness: Alert Mood: Depressed, Sad, Pleasant Affect: Appropriate to circumstance, Depressed, Sad Anxiety Level: Minimal Thought Processes: Coherent, Relevant Judgement: Unimpaired Orientation: Person, Place, Time, Situation, Appropriate for developmental age Obsessive Compulsive Thoughts/Behaviors: Minimal  Cognitive Functioning Concentration: Normal Memory: Recent Intact, Remote Intact Is patient IDD: No Is patient DD?: No Insight: Fair Impulse Control: Fair Appetite: Fair Have you had any weight changes? : No Change Sleep: Decreased Total Hours of Sleep: 4 Vegetative Symptoms: None  ADLScreening [facility] Assessment  Services) Patient's cognitive ability adequate to safely complete daily activities?: Yes Patient able to express need for assistance with ADLs?: Yes Independently performs ADLs?: Yes (appropriate for developmental age)  Prior Inpatient Therapy Prior Inpatient Therapy: No  Prior Outpatient Therapy Prior Outpatient Therapy: No Does patient have an ACCT team?: No Does patient have Intensive In-House Services?  : No Does patient have [facility] services? : No Does patient have P4CC services?: No  ADL Screening (condition at time of admission) Patient's cognitive ability adequate to safely complete daily activities?: Yes Is the patient deaf or have difficulty hearing?: No Does the patient have difficulty seeing, even when wearing glasses/contacts?: No Does the patient have difficulty concentrating, remembering, or making decisions?: No Patient able to express need for assistance with ADLs?: Yes Does the patient have difficulty dressing or bathing?: No Independently performs ADLs?: Yes (appropriate for developmental age) Does the patient have difficulty walking or climbing stairs?: No Weakness of Legs: None Weakness of Arms/Hands: None  Home Assistive Devices/Equipment Home Assistive Devices/Equipment: None  Therapy Consults (therapy consults require a physician order) PT Evaluation Needed: No OT Evalulation Needed: No SLP Evaluation Needed: No Abuse/Neglect Assessment (Assessment to be complete while patient is alone) Abuse/Neglect Assessment Can Be Completed: Yes Physical Abuse: Denies Verbal Abuse: Denies Sexual Abuse: Denies Exploitation of patient/patient's resources: Denies Self-Neglect: Denies Values / Beliefs Cultural Requests During Hospitalization: None Spiritual Requests During Hospitalization: None Consults Spiritual Care Consult Needed: No Social Work Consult Needed: No         Child/Adolescent Assessment Running Away Risk: Denies(Patient is an  adult)  Disposition:  Disposition Initial Assessment Completed for this Encounter: Yes  On Site Evaluation by:   Reviewed with Physician:    Gunnar Fusi MS, LCAS, [place], [place], CCSI Therapeutic Triage Specialist 10/29/2017 1:03 PM

## 2017-10-29 NOTE — ED Notes (Signed)
Admitted BMU

## 2017-10-29 NOTE — ED Provider Notes (Signed)
[facility] Emergency Department Provider Note  ____________________________________________  Time seen: Approximately 11:23 AM  I have reviewed the triage vital signs and the nursing notes.   HISTORY  Chief Complaint Suicidal    HPI Andrew Frederick. is a 22 y.o. male M otherwise healthy and without any history of psychiatric illness, presenting for depression and suicidal ideation.  The patient reports that for the past 2 weeks, he has had thoughts about killing himself, which he plans to do with overdose.  He denies taking any substances or taking action on his thoughts.  He denies homicidal ideations or hallucinations.  He has no medical complaints at this time.  History reviewed. No pertinent past medical history.  Patient Active Problem List   Diagnosis Date Noted  . Muscle tension headache 06/04/2017  . 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 Parotiditis 06/04/2017  . 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 Multiple leg contusions 06/05/2015    History reviewed. No pertinent surgical history.  Current Outpatient Rx  . Order #: [id number] Class: Normal    Allergies Patient has no known allergies.  Family History  Problem Relation Age of Onset  . Diabetes Father   . Hyperlipidemia Father     Social History Social History   Tobacco Use  . Smoking status: Never Smoker  . Smokeless tobacco: Never Used  Substance Use Topics  . Alcohol use: No    Alcohol/week: 0.0 oz  . Drug use: No    Review of Systems Constitutional: No fever/chills. Eyes: No visual changes. ENT: No sore throat. No congestion or rhinorrhea. Cardiovascular: Denies chest pain. Denies palpitations. Respiratory: Denies shortness of breath.  No cough. Gastrointestinal: No abdominal pain.  No nausea, no vomiting.  No diarrhea.  No constipation. Genitourinary: Negative for dysuria. Musculoskeletal: Negative for back pain. Skin: Negative for rash. Neurological:  Negative for headaches. No focal numbness, tingling or weakness.  Psychiatric:Positive depression and suicidal ideation.  Positive suicidal plan.  Negative HI, negative hallucinations. ____________________________________________   PHYSICAL EXAM:  VITAL SIGNS: ED Triage Vitals  Enc Vitals Group     BP 10/29/17 0948 136/79     Pulse Rate 10/29/17 0948 82     Resp 10/29/17 0948 14     Temp 10/29/17 0948 98.4 F (36.9 C)     Temp Source 10/29/17 0948 Oral     SpO2 10/29/17 0948 100 %     Weight 10/29/17 0946 145 lb (65.8 kg)     Height 10/29/17 0946 6' (1.829 m)     Head Circumference --      Peak Flow --      Pain Score 10/29/17 0945 0     Pain Loc --      Pain Edu? --      Excl. in [place]? --     Constitutional: Alert and oriented. Well appearing and in no acute distress. Answers questions appropriately. Eyes: Conjunctivae are normal.  EOMI. No scleral icterus. Head: Atraumatic. Nose: No congestion/rhinnorhea. Mouth/Throat: Mucous membranes are moist.  Neck: No stridor.  Supple.   Cardiovascular: Normal rate, regular rhythm. No murmurs, rubs or gallops.  Respiratory: Normal respiratory effort.  No accessory muscle use or retractions. Lungs CTAB.  No wheezes, rales or ronchi. }Musculoskeletal: No LE edema. Neurologic:  A&Ox3.  Speech is clear.  Face and smile are symmetric.  EOMI.  Moves all extremities well. Skin:  Skin is warm, dry and intact. No rash noted. Psychiatric: Breast mood and flat affect.  Normal speech.  Good insight into his illness.  Does endorse SI with plan on my examination.  No HI or hallucinations.  ____________________________________________   LABS (all labs ordered are listed, but only abnormal results are displayed)  Labs Reviewed  COMPREHENSIVE METABOLIC PANEL - Abnormal; Notable for the following components:      Result Value   Total Protein 8.4 (*)    All other components within normal limits  ACETAMINOPHEN LEVEL - Abnormal; Notable for the  following components:   Acetaminophen (Tylenol), Serum <10 (*)    All other components within normal limits  ETHANOL  SALICYLATE LEVEL  CBC  URINE DRUG SCREEN, QUALITATIVE ([facility] ONLY)   ____________________________________________  EKG  Not indicated ____________________________________________  RADIOLOGY  No results found.  ____________________________________________   PROCEDURES  Procedure(s) performed: None  Procedures  Critical Care performed: No ____________________________________________   INITIAL IMPRESSION / ASSESSMENT AND PLAN / ED COURSE  Pertinent labs & imaging results that were available during my care of the patient were reviewed by me and considered in my medical decision making (see chart for details).  22 y.o. male w/ no prior hx of psychiatric illness presenting w/ depression and SI w/ plan.  Overall, the pt is hemodynamically stable and there is no evidence for medical cause of his sx's, including endocrinopathy or intracranial mass.  At this time, the patient has been medically cleared for psychiatric disposition.  ____________________________________________  FINAL CLINICAL IMPRESSION(S) / ED DIAGNOSES  Final diagnoses:  Suicidal ideation         NEW MEDICATIONS STARTED DURING THIS VISIT:  New Prescriptions   No medications on file      Eula Listen, MD 10/29/17 1135

## 2017-10-29 NOTE — ED Notes (Signed)
Pt. Alert and oriented, warm and dry, in no distress. Pt. Denies SI, HI, and AVH. Pt. Encouraged to let nursing staff know of any concerns or needs. 

## 2017-10-30 ENCOUNTER — Other Ambulatory Visit: Payer: Self-pay

## 2017-10-30 DIAGNOSIS — F332 Major depressive disorder, recurrent severe without psychotic features: Principal | ICD-10-CM

## 2017-10-30 LAB — LIPID PANEL
CHOLESTEROL: 140 mg/dL (ref 0–200)
HDL: 53 mg/dL (ref >40)
LDL Cholesterol: 76 mg/dL (ref 0–99)
Total CHOL/HDL Ratio: 2.6 RATIO
Triglycerides: 57 mg/dL (ref <150)
VLDL: 11 mg/dL (ref 0–40)

## 2017-10-30 LAB — TSH: TSH: 3.081 u[IU]/mL (ref 0.350–4.500)

## 2017-10-30 LAB — URINE DRUG SCREEN, QUALITATIVE (ARMC ONLY)
AMPHETAMINES, UR SCREEN: NOT DETECTED
Barbiturates, Ur Screen: NOT DETECTED
Benzodiazepine, Ur Scrn: NOT DETECTED
COCAINE METABOLITE, UR ~~LOC~~: NOT DETECTED
Cannabinoid 50 Ng, Ur ~~LOC~~: NOT DETECTED
MDMA (ECSTASY) UR SCREEN: NOT DETECTED
Methadone Scn, Ur: NOT DETECTED
Opiate, Ur Screen: NOT DETECTED
Phencyclidine (PCP) Ur S: NOT DETECTED
TRICYCLIC, UR SCREEN: NOT DETECTED

## 2017-10-30 LAB — HEMOGLOBIN A1C
HEMOGLOBIN A1C: 4.5 % — AB (ref 4.8–5.6)
MEAN PLASMA GLUCOSE: 82.45 mg/dL

## 2017-10-30 NOTE — Plan of Care (Signed)
  Problem: Spiritual Needs Goal: Ability to function at adequate level Outcome: Progressing Note:  Up for meals, attending groups.  Maintaining personal care chores appropriately.  Up to dayroom watching tv with peers.     Problem: Education: Goal: Knowledge of [facility] [facility] Education information/materials will improve Outcome: Progressing Goal: Emotional status will improve Outcome: Progressing Note:  Rates depression as 5/10, hopelessness as 8/10 and anxiety as 3/10 Goal: Mental status will improve Outcome: Progressing Note:  Rates depression as 5/10, hopelessness as 8/10 and anxiety as 3/10 Goal: Verbalization of understanding the information provided will improve Outcome: Progressing   Problem: Activity: Goal: Interest or engagement in activities will improve Outcome: Progressing Note:  Visible in the milieu.  Attending groups.  Out of room for meals.  Visible in th dayroom.   Goal: Sleeping patterns will improve Outcome: Progressing   Problem: Coping: Goal: Ability to verbalize frustrations and anger appropriately will improve Outcome: Progressing Goal: Ability to demonstrate self-control will improve Outcome: Progressing Note:  Pleasant and cooperative.  No inappropriate or disruptive behavior exhibited.     Problem: Health Behavior/Discharge Planning: Goal: Identification of resources available to assist in meeting health care needs will improve Outcome: Progressing Goal: Compliance with treatment plan for underlying cause of condition will improve Outcome: Progressing Note:  Receptive to treatment regiment   Problem: Physical Regulation: Goal: Ability to maintain clinical measurements within normal limits will improve Outcome: Progressing   Problem: Safety: Goal: Periods of time without injury will increase Outcome: Progressing Note:  Remains safe on the unit   Problem: Education: Goal: Ability to make informed decisions regarding treatment will  improve Outcome: Progressing   Problem: Coping: Goal: Coping ability will improve Outcome: Progressing   Problem: Health Behavior/Discharge Planning: Goal: Identification of resources available to assist in meeting health care needs will improve Outcome: Progressing   Problem: Medication: Goal: Compliance with prescribed medication regimen will improve Outcome: Progressing Note:  Medication compliant   Problem: Self-Concept: Goal: Ability to disclose and discuss suicidal ideas will improve Outcome: Progressing Note:  Denies Si Goal: Will verbalize positive feelings about self Outcome: Progressing   Problem: Activity: Goal: Will identify at least one activity in which they can participate Outcome: Progressing   Problem: Coping: Goal: Ability to identify and develop effective coping behavior will improve Outcome: Progressing Goal: Ability to interact with others will improve Outcome: Progressing Goal: Demonstration of participation in decision-making regarding own care will improve Outcome: Progressing Goal: Ability to use eye contact when communicating with others will improve Outcome: Progressing   Problem: Health Behavior/Discharge Planning: Goal: Identification of resources available to assist in meeting health care needs will improve Outcome: Progressing   Problem: Self-Concept: Goal: Will verbalize positive feelings about self Outcome: Progressing   Problem: Education: Goal: Knowledge of the prescribed therapeutic regimen will improve Outcome: Progressing   Problem: Coping: Goal: Coping ability will improve Outcome: Progressing

## 2017-10-30 NOTE — Tx Team (Addendum)
Interdisciplinary Treatment and Diagnostic Plan Update  10/30/2017 Time of Session: [name]. MRN: [id number]  Principal Diagnosis: <principal problem not specified>  Secondary Diagnoses: Active Problems:   Severe recurrent major depression without psychotic features (HCC)   Current Medications:  Current Facility-Administered Medications  Medication Dose Route Frequency Provider Last Rate Last Dose  . acetaminophen (TYLENOL) tablet 650 mg  650 mg Oral Q6H PRN Clapacs, Durwin T, MD      . alum & mag hydroxide-simeth (MAALOX/MYLANTA) 200-200-20 MG/5ML suspension 30 mL  30 mL Oral Q4H PRN Clapacs, Jaiel T, MD      . FLUoxetine (PROZAC) capsule 20 mg  20 mg Oral Daily Clapacs, Krish T, MD      . hydrOXYzine (ATARAX/VISTARIL) tablet 50 mg  50 mg Oral TID PRN Clapacs, Madie Reno, MD   50 mg at 10/29/17 2106  . magnesium hydroxide (MILK OF MAGNESIA) suspension 30 mL  30 mL Oral Daily PRN Clapacs, Lemoine T, MD      . traZODone (DESYREL) tablet 100 mg  100 mg Oral QHS PRN Clapacs, Madie Reno, MD   100 mg at 10/29/17 2106   PTA Medications: Medications Prior to Admission  Medication Sig Dispense Refill Last Dose  . cyclobenzaprine (FLEXERIL) 10 MG tablet Take 1 tablet (10 mg total) at bedtime by mouth. 30 tablet 0     Patient Stressors: Loss of "Fiancee; A little over a year now.."  Patient Strengths: Ability for insight Average or above average intelligence General fund of knowledge Motivation for treatment/growth Religious Affiliation  Treatment Modalities: Medication Management, Group therapy, Case management,  1 to 1 session with clinician, Psychoeducation, Recreational therapy.   Physician Treatment Plan for Primary Diagnosis: <principal problem not specified> Long Term Goal(s):     Short Term Goals:    Medication Management: Evaluate patient's response, side effects, and tolerance of medication regimen.  Therapeutic Interventions: 1 to 1 sessions, Unit Group sessions and  Medication administration.  Evaluation of Outcomes: Progressing  Physician Treatment Plan for Secondary Diagnosis: Active Problems:   Severe recurrent major depression without psychotic features ([place])  Long Term Goal(s):     Short Term Goals:       Medication Management: Evaluate patient's response, side effects, and tolerance of medication regimen.  Therapeutic Interventions: 1 to 1 sessions, Unit Group sessions and Medication administration.  Evaluation of Outcomes: Progressing   RN Treatment Plan for Primary Diagnosis: <principal problem not specified> Long Term Goal(s): Knowledge of disease and therapeutic regimen to maintain health will improve  Short Term Goals: Ability to verbalize feelings will improve, Ability to identify and develop effective coping behaviors will improve and Compliance with prescribed medications will improve  Medication Management: RN will administer medications as ordered by provider, will assess and evaluate patient's response and provide education to patient for prescribed medication. RN will report any adverse and/or side effects to prescribing provider.  Therapeutic Interventions: 1 on 1 counseling sessions, Psychoeducation, Medication administration, Evaluate responses to treatment, Monitor vital signs and CBGs as ordered, Perform/monitor CIWA, COWS, AIMS and Fall Risk screenings as ordered, Perform wound care treatments as ordered.  Evaluation of Outcomes: Progressing   LCSW Treatment Plan for Primary Diagnosis: <principal problem not specified> Long Term Goal(s): Safe transition to appropriate next level of care at discharge, Engage patient in therapeutic group addressing interpersonal concerns.  Short Term Goals: Engage patient in aftercare planning with referrals and resources, Increase emotional regulation and Increase skills for wellness and recovery  Therapeutic Interventions: Assess  for all discharge needs, 1 to 1 time with Education officer, museum,  Explore available resources and support systems, Assess for adequacy in community support network, Educate family and significant other(s) on suicide prevention, Complete Psychosocial Assessment, Interpersonal group therapy.  Evaluation of Outcomes: Progressing   Progress in Treatment: Attending groups: Yes. Participating in groups: Yes. Taking medication as prescribed: Yes. Toleration medication: Yes. Family/Significant other contact made: No, will contact:  pt's father Patient understands diagnosis: Yes. Discussing patient identified problems/goals with staff: Yes. Medical problems stabilized or resolved: Yes. Denies suicidal/homicidal ideation: Yes. Issues/concerns per patient self-inventory: No. Other: None at this time.   New problem(s) identified: No, Describe:  none at this time.  New Short Term/Long Term Goal(s): Pt reported his goal for treatment is to, "work on my depression."   Discharge Plan or Barriers: Pt will discharge to his parent's home and will continue treatment in the outpatient setting.   Reason for Continuation of Hospitalization: Depression Medication stabilization  Estimated Length of Stay: 3-4 days   Recreational Therapy: Patient Stressors: Relationship, Death Patient Goal: Patient will identify 3 positive coping skills to decrease depressive symptoms within 5 recreation therapy group sessions  Attendees: Patient: Andrew Frederick, Andrew Frederick.  10/30/2017 11:23 AM  Physician: Dr. Wonda Olds, MD 10/30/2017 11:23 AM  Nursing: Roetta Sessions, RN 10/30/2017 11:23 AM  RN Care Manager: 10/30/2017 11:23 AM  Social Worker: Alden Hipp, LCSW 10/30/2017 11:23 AM  Recreational Therapist:  Roanna Epley, CTRS-LRT 10/30/2017 11:23 AM  Other: Darin Engels, [place] 10/30/2017 11:23 AM  Other: Thea Silversmith, LCSWA 10/30/2017 11:23 AM  Other: 10/30/2017 11:23 AM     Scribe for Treatment Team: Alden Hipp, LCSW 10/30/2017 11:30 AM

## 2017-10-30 NOTE — Plan of Care (Signed)
Patient slept for Estimated Hours of 7.45; Precautionary checks every 15 minutes for safety maintained, room free of safety hazards, patient sustains no injury or falls during this shift.  Problem: Spiritual Needs Goal: Ability to function at adequate level Outcome: Progressing   Problem: Education: Goal: Knowledge of [facility] Education information/materials will improve Outcome: Progressing Goal: Emotional status will improve Outcome: Progressing Goal: Mental status will improve Outcome: Progressing Goal: Verbalization of understanding the information provided will improve Outcome: Progressing   Problem: Activity: Goal: Interest or engagement in activities will improve Outcome: Progressing Goal: Sleeping patterns will improve Outcome: Progressing   Problem: Coping: Goal: Ability to verbalize frustrations and anger appropriately will improve Outcome: Progressing Goal: Ability to demonstrate self-control will improve Outcome: Progressing   Problem: Health Behavior/Discharge Planning: Goal: Identification of resources available to assist in meeting health care needs will improve Outcome: Progressing Goal: Compliance with treatment plan for underlying cause of condition will improve Outcome: Progressing   Problem: Physical Regulation: Goal: Ability to maintain clinical measurements within normal limits will improve Outcome: Progressing   Problem: Safety: Goal: Periods of time without injury will increase Outcome: Progressing   Problem: Education: Goal: Ability to make informed decisions regarding treatment will improve Outcome: Progressing   Problem: Coping: Goal: Coping ability will improve Outcome: Progressing   Problem: Health Behavior/Discharge Planning: Goal: Identification of resources available to assist in meeting health care needs will improve Outcome: Progressing   Problem: Medication: Goal: Compliance with prescribed medication regimen will  improve Outcome: Progressing   Problem: Self-Concept: Goal: Ability to disclose and discuss suicidal ideas will improve Outcome: Progressing Goal: Will verbalize positive feelings about self Outcome: Progressing   Problem: Activity: Goal: Will identify at least one activity in which they can participate Outcome: Progressing   Problem: Coping: Goal: Ability to identify and develop effective coping behavior will improve Outcome: Progressing Goal: Ability to interact with others will improve Outcome: Progressing Goal: Demonstration of participation in decision-making regarding own care will improve Outcome: Progressing Goal: Ability to use eye contact when communicating with others will improve Outcome: Progressing   Problem: Health Behavior/Discharge Planning: Goal: Identification of resources available to assist in meeting health care needs will improve Outcome: Progressing   Problem: Self-Concept: Goal: Will verbalize positive feelings about self Outcome: Progressing   Problem: Education: Goal: Knowledge of the prescribed therapeutic regimen will improve Outcome: Progressing   Problem: Coping: Goal: Coping ability will improve Outcome: Progressing

## 2017-10-30 NOTE — BHH Suicide Risk Assessment (Signed)
[facility] Admission Suicide Risk Assessment   Nursing information obtained from:  Patient, Review of record Demographic factors:  Male, Caucasian Current Mental Status:  Suicide plan, Self-harm thoughts Loss Factors:  Loss of significant relationship(Fiancee Died a little over a year ago.) Historical Factors:  Anniversary of important loss Risk Reduction Factors:  Sense of responsibility to family, Living with another person, especially a relative  Total Time spent with patient: 1 hour Principal Problem: Severe recurrent major depression without psychotic features ([place]) Diagnosis:   Patient Active Problem List   Diagnosis Date Noted  . Severe recurrent major depression without psychotic features ([place]) [F33.2] 10/29/2017    Priority: High  . Suicidal ideation [R45.851] 10/29/2017  . Muscle tension headache [G44.209] 06/04/2017  . 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 Parotiditis [K11.20] 06/04/2017  . 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 Multiple leg contusions [S80.10XA] 06/05/2015   Subjective Data: See H&P  Continued Clinical Symptoms:  Alcohol Use Disorder Identification Test Final Score (AUDIT): 0 The "Alcohol Use Disorders Identification Test", Guidelines for Use in Primary Care, Second Edition.  World Pharmacologist [facility]). Score between 0-7:  no or low risk or alcohol related problems. Score between 8-15:  moderate risk of alcohol related problems. Score between 16-19:  high risk of alcohol related problems. Score 20 or above:  warrants further diagnostic evaluation for alcohol dependence and treatment.   CLINICAL FACTORS:   Depression:   Insomnia   COGNITIVE FEATURES THAT CONTRIBUTE TO RISK:  None    SUICIDE RISK:   Moderate:  Frequent suicidal ideation with limited intensity, and duration, some specificity in terms of plans, no associated intent, good self-control, limited dysphoria/symptomatology, some risk factors present, and identifiable  protective factors, including available and accessible social support.  PLAN OF CARE: See H&P  I certify that inpatient services furnished can reasonably be expected to improve the patient's condition.   Marylin Crosby, MD 10/30/2017, 12:45 PM

## 2017-10-30 NOTE — Tx Team (Signed)
Initial Treatment Plan 10/30/2017 3:36 AM Andrew Frederick. WLN:[id number]    PATIENT STRESSORS: Loss of "Fiancee; A little over a year now.."   PATIENT STRENGTHS: Ability for insight Average or above average intelligence General fund of knowledge Motivation for treatment/growth Religious Affiliation   PATIENT IDENTIFIED PROBLEMS: Mood Instability  Grieving  Ineffective coping skill  Suicidal Thoughts               DISCHARGE CRITERIA:  Ability to meet basic life and health needs Improved stabilization in mood, thinking, and/or behavior Motivation to continue treatment in a less acute level of care Reduction of life-threatening or endangering symptoms to within safe limits Verbal commitment to aftercare and medication compliance  PRELIMINARY DISCHARGE PLAN: Outpatient therapy Return to previous living arrangement  PATIENT/FAMILY INVOLVEMENT: This treatment plan has been presented to and reviewed with the patient, Andrew Frederick., and/or family member.  The patient and family have been given the opportunity to ask questions and make suggestions.  Electa Sniff, RN 10/30/2017, 3:36 AM

## 2017-10-30 NOTE — Progress Notes (Signed)
Recreation Therapy Notes  Date: 10/30/2017  Time: 9:30 am  Location: Craft Room  Behavioral response: Appropriate  Intervention Topic: Stress  Discussion/Intervention:  Group content on today was focused on stress. The group defined stress and way to cope with stress. Participants expressed how they know when they are stresses out. Individuals described the different ways they have to cope with stress. The group stated reasons why it is important to cope with stress. Patient explained what good stress is and some examples. The group participated in the intervention "Stress Management Jeopardy". Individuals were separated into two group and answered questions related to stress.  Clinical Observations/Feedback:  Patient came to group and stated taking walks is a way to decrease his stress. He was pulled from group by other disciplines and later returned to group. Individual participated in the intervention and was social with staff during group.  Regine Christian LRT/CTRS        Trenisha Lafavor 10/30/2017 11:39 AM

## 2017-10-30 NOTE — BHH Group Notes (Signed)

## 2017-10-30 NOTE — H&P (Addendum)
Psychiatric Admission Assessment Adult  Patient Identification: [employer]. MRN:  [id number] Date of Evaluation:  10/30/2017 Chief Complaint:  "I've been dealing with depression" Principal Diagnosis: Severe recurrent major depression without psychotic features ([place]) Diagnosis:   Patient Active Problem List   Diagnosis Date Noted  . Suicidal ideation [R45.851] 10/29/2017  . Severe recurrent major depression without psychotic features ([place]) [F33.2] 10/29/2017  . Muscle tension headache [G44.209] 06/04/2017  . 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 Parotiditis [K11.20] 06/04/2017  . 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 Multiple leg contusions [S80.10XA] 06/05/2015   History of Present Illness: 22 yo male admitted due to depression and SI. Pt has not mental health history. His fiance passed away suddenly about a year ago and this has been devastating to him. He is not sure exactly how she died. She had a seizure and then passed away the next day suddenly. Since then, he has been feeling depressed, sad, irritable towards people. He has insomnia and wakes up several times a night. His appetite is good and he feels like he concentrates okay at work. He has started to have thoughts of suicide and has thought about overdosing. He does not know what he would overdose on. He states that these thoughts started about a week ago. They scared him so he told his father and they came to the hospital. He denies that he has been researching ways to kill himself. He states that he has a recurring nightmares 'of demons wanting to choke me." He denies AH, VH or paranoia. HE works full time at [employer] and really likes his job. He states that keeping busy has helped him and that is what keeps him going. HE does not have much of a social life and not many friends since graduating high school. HE stays home a lot and plays video games. His parents are very supportive and he is able to talk  to them when feeling stressed. He has never had mental health treatment in the past and no inpatient hospitalization. HE is very open to medications and seeing a therapist.   I spoke to his father, Bethany Cumming. He states that he has been depressed since the sudden passing of his fiance. He gets depressed around certain anniversaries. He was really depressed in December when it was the anniversary of her passing. He slowly came out of it and was doing okay. He states that at work yesterday, pt got irritable and "hateful with me" which is out of the ordinary for him. His father got upset and told him he was going to suspend him for 2 days. He then asked his son if something was bothering him and that is when he told him that he was feeling very depressed and having thoughts of hurting himself. Pt wanted his father to help him get some help. [place] that they have gotten very close the past few years and go to games and fishing together. Pt is very mild mannered and never gets upset. His father would like to see him get a therapist to help deal with the grief.   Associated Signs/Symptoms: Depression Symptoms:  depressed mood, anhedonia, insomnia, fatigue, feelings of worthlessness/guilt, suicidal thoughts with specific plan, (Hypo) Manic Symptoms:  Langley Gauss any manic symptosm including decreased need for sleep, impulsivity, risky behvaiors Anxiety Symptoms:  Denies feeling anxious Psychotic Symptoms:  None PTSD Symptoms: Negative Total Time spent with patient: 1 hour  Past Psychiatric History: No past psychiatric  history. NO inpatient hospitalization and has never seen a psychiatrist or therapist. No past medication trials.   Is the patient at risk to self? Yes.    Has the patient been a risk to self in the past 6 months? No.  Has the patient been a risk to self within the distant past? No.  Is the patient a risk to others? No.  Has the patient been a risk to others in the past 6 months?  No.  Has the patient been a risk to others within the distant past? No.    Alcohol Screening: 1. How often do you have a drink containing alcohol?: Never 2. How many drinks containing alcohol do you have on a typical day when you are drinking?: 1 or 2 3. How often do you have six or more drinks on one occasion?: Never AUDIT-C Score: 0 4. How often during the last year have you found that you were not able to stop drinking once you had started?: Never 5. How often during the last year have you failed to do what was normally expected from you becasue of drinking?: Never 6. How often during the last year have you needed a first drink in the morning to get yourself going after a heavy drinking session?: Never 7. How often during the last year have you had a feeling of guilt of remorse after drinking?: Never 8. How often during the last year have you been unable to remember what happened the night before because you had been drinking?: Never 9. Have you or someone else been injured as a result of your drinking?: No 10. Has a relative or friend or a doctor or another health worker been concerned about your drinking or suggested you cut down?: No Alcohol Use Disorder Identification Test Final Score (AUDIT): 0 Substance Abuse History in the last 12 months:  No. Consequences of Substance Abuse: Negative Previous Psychotropic Medications: No  Psychological Evaluations: No  Past Medical History: History reviewed. No pertinent past medical history. History reviewed. No pertinent surgical history. Family History:  Family History  Problem Relation Age of Onset  . Diabetes Father   . Hyperlipidemia Father    Family Psychiatric  History: "My dad has mood swings." No formal psychiatric diagnosis in the family.  Tobacco Screening: Have you used any form of tobacco in the last 30 days? (Cigarettes, Smokeless Tobacco, Cigars, and/or Pipes): No Social History: He was born and raised in [place] to married  parents. They are very supportive. Denies any history of abuse. He graduated high school. NO college. He works currently in a warehouse placing orders. He has an older sister. He lives with his parents. He is not in a relationship currently and no kids. His fiance passed away suddenly last year. He is not sure what happened. He is close to his parents.  Social History   Substance and Sexual Activity  Alcohol Use No  . Alcohol/week: 0.0 oz     Social History   Substance and Sexual Activity  Drug Use No    Additional Social History:                           Allergies:  No Known Allergies Lab Results:  Results for orders placed or performed during the hospital encounter of 10/29/17 (from the past 48 hour(s))  Urine Drug Screen, Qualitative     Status: None   Collection Time: 10/29/17  9:53 AM  Result Value  Ref Range   Tricyclic, Ur Screen NONE DETECTED NONE DETECTED   Amphetamines, Ur Screen NONE DETECTED NONE DETECTED   MDMA (Ecstasy)Ur Screen NONE DETECTED NONE DETECTED   Cocaine Metabolite,Ur [place] NONE DETECTED NONE DETECTED   Opiate, Ur Screen NONE DETECTED NONE DETECTED   Phencyclidine (PCP) Ur S NONE DETECTED NONE DETECTED   Cannabinoid 50 Ng, Ur [place] NONE DETECTED NONE DETECTED   Barbiturates, Ur Screen NONE DETECTED NONE DETECTED   Benzodiazepine, Ur Scrn NONE DETECTED NONE DETECTED   Methadone Scn, Ur NONE DETECTED NONE DETECTED    Comment: (NOTE) Tricyclics + metabolites, urine    Cutoff 1000 ng/mL Amphetamines + metabolites, urine  Cutoff 1000 ng/mL MDMA (Ecstasy), urine              Cutoff 500 ng/mL Cocaine Metabolite, urine          Cutoff 300 ng/mL Opiate + metabolites, urine        Cutoff 300 ng/mL Phencyclidine (PCP), urine         Cutoff 25 ng/mL Cannabinoid, urine                 Cutoff 50 ng/mL Barbiturates + metabolites, urine  Cutoff 200 ng/mL Benzodiazepine, urine              Cutoff 200 ng/mL Methadone, urine                   Cutoff 300 ng/mL The  urine drug screen provides only a preliminary, unconfirmed analytical test result and should not be used for non-medical purposes. Clinical consideration and professional judgment should be applied to any positive drug screen result due to possible interfering substances. A more specific alternate chemical method must be used in order to obtain a confirmed analytical result. Gas chromatography / mass spectrometry (GC/MS) is the preferred confirmat ory method. Performed at [facility], [address] [place], [place] [address]   Hemoglobin A1c     Status: Abnormal   Collection Time: 10/30/17  7:06 AM  Result Value Ref Range   Hgb A1c MFr Bld 4.5 (L) 4.8 - 5.6 %    Comment: (NOTE) Pre diabetes:          5.7%-6.4% Diabetes:              >6.4% Glycemic control for   <7.0% adults with diabetes    Mean Plasma Glucose 82.45 mg/dL    Comment: Performed at [facility] [address] [place], [place] [address]  Lipid panel     Status: None   Collection Time: 10/30/17  7:06 AM  Result Value Ref Range   Cholesterol 140 0 - 200 mg/dL   Triglycerides 57 <150 mg/dL   HDL 53 >40 mg/dL   Total CHOL/HDL Ratio 2.6 RATIO   VLDL 11 0 - 40 mg/dL   LDL Cholesterol 76 0 - 99 mg/dL    Comment:        Total Cholesterol/HDL:CHD Risk Coronary Heart Disease Risk Table                     Men   Women  1/2 Average Risk   3.4   3.3  Average Risk       5.0   4.4  2 X Average Risk   9.6   7.1  3 X Average Risk  23.4   11.0        Use the calculated Patient Ratio above and the  CHD Risk Table to determine the patient's CHD Risk.        ATP III CLASSIFICATION (LDL):  <100     mg/dL   Optimal  100-129  mg/dL   Near or Above                    Optimal  130-159  mg/dL   Borderline  160-189  mg/dL   High  >190     mg/dL   Very High Performed at [facility], [address] [place], [place] [address]   TSH     Status: None   Collection Time: 10/30/17  7:06 AM  Result  Value Ref Range   TSH 3.081 0.350 - 4.500 uIU/mL    Comment: Performed by a 3rd Generation assay with a functional sensitivity of <=0.01 uIU/mL. Performed at [facility], [address] [place], [place] [address]     Blood Alcohol level:  Lab Results  Component Value Date   [facility] <10 13/02/6577    Metabolic Disorder Labs:  Lab Results  Component Value Date   HGBA1C 4.5 (L) 10/30/2017   MPG 82.45 10/30/2017   No results found for: PROLACTIN Lab Results  Component Value Date   CHOL 140 10/30/2017   TRIG 57 10/30/2017   HDL 53 10/30/2017   CHOLHDL 2.6 10/30/2017   VLDL 11 10/30/2017   LDLCALC 76 10/30/2017    Current Medications: Current Facility-Administered Medications  Medication Dose Route Frequency Provider Last Rate Last Dose  . acetaminophen (TYLENOL) tablet 650 mg  650 mg Oral Q6H PRN Clapacs, Kaelem T, MD      . alum & mag hydroxide-simeth (MAALOX/MYLANTA) 200-200-20 MG/5ML suspension 30 mL  30 mL Oral Q4H PRN Clapacs, Alandis T, MD      . FLUoxetine (PROZAC) capsule 20 mg  20 mg Oral Daily Clapacs, Jorel T, MD      . hydrOXYzine (ATARAX/VISTARIL) tablet 50 mg  50 mg Oral TID PRN Clapacs, Madie Reno, MD   50 mg at 10/29/17 2106  . magnesium hydroxide (MILK OF MAGNESIA) suspension 30 mL  30 mL Oral Daily PRN Clapacs, Jayce T, MD      . traZODone (DESYREL) tablet 100 mg  100 mg Oral QHS PRN Clapacs, Madie Reno, MD   100 mg at 10/29/17 2106   PTA Medications: Medications Prior to Admission  Medication Sig Dispense Refill Last Dose  . cyclobenzaprine (FLEXERIL) 10 MG tablet Take 1 tablet (10 mg total) at bedtime by mouth. 30 tablet 0     Musculoskeletal: Strength & Muscle Tone: within normal limits Gait & Station: normal Patient leans: N/A  Psychiatric Specialty Exam: Physical Exam  Nursing note and vitals reviewed.   Review of Systems  Neurological: Negative for dizziness.  Psychiatric/Behavioral: Positive for depression and suicidal ideas. Negative for  hallucinations and memory loss. The patient has insomnia. The patient is not nervous/anxious.   All other systems reviewed and are negative.   Blood pressure 111/79, pulse 68, temperature 97.8 F (36.6 C), temperature source Oral, resp. rate 18, height 6' (1.829 m), weight 56.3 kg (124 lb 1.9 oz), SpO2 100 %.Body mass index is 16.83 kg/m.  General Appearance: Casual  Eye Contact:  Fair  Speech:  Slow  Volume:  Decreased  Mood:  Depressed  Affect:  Constricted  Thought Process:  Coherent and Goal Directed  Orientation:  Full (Time, Place, and Person)  Thought Content:  Logical  Suicidal Thoughts:  Yes.  with intent/plan  Homicidal Thoughts:  No  Memory:  Immediate;   Fair  Judgement:  Fair  Insight:  Fair  Psychomotor Activity:  Decreased  Concentration:  Concentration: Fair  Recall:  [employer] of Knowledge:  Fair  Language:  Fair  Akathisia:  No      Assets:  Resilience  ADL's:  Intact  Cognition:  WNL  Sleep:  Number of Hours: 7.45    Treatment Plan Summary: 22 yo male admitted due to depression and SI since his fiance died last year. HE has very little social life but does enjoy working. HE has not had any mental health history prior to the passing of his fiance. He is very open to trying medications and would greatly benefit from therapy to work on grief counseling.   Plan:  Major depressive disorder -Start Prozac 20 mg daily. Discussed risks and benefits of medications. Discussed side effects including potential for worsening suicidal thoughts and if this were to occur, stop medications and call physical immediately. He expressed understanding of this.  -prn trazodone for sleep  QTc 402, TSH wnl, CBC, CMP wnl  Dispo -He will return to his parents home on discharge. HE will need follow up for med management and therapy. I spoke with his father today.   Observation Level/Precautions:  15 minute checks  Laboratory:  Done in ED  Psychotherapy:    Medications:     Consultations:    Discharge Concerns:    Estimated LOS: 3-5 days  Other:     Physician Treatment Plan for Primary Diagnosis: Severe recurrent major depression without psychotic features ([place]) Long Term Goal(s): Improvement in symptoms so as ready for discharge  Short Term Goals: Ability to disclose and discuss suicidal ideas    I certify that inpatient services furnished can reasonably be expected to improve the patient's condition.    Marylin Crosby, MD 4/12/201912:19 PM

## 2017-10-30 NOTE — BHH Counselor (Signed)
Adult Comprehensive Assessment  Patient ID: Andrew Eltzroth., male   DOB: May 18, 1996, 22 y.o.   MRN: [id number]  Information Source: Information source: Patient  Current Stressors:  Educational / Learning stressors: None reported.  Employment / Job issues: None reported.  Family Relationships: None reported.  Financial / Lack of resources (include bankruptcy): None reported.  Housing / Lack of housing: None reported.  Physical health (include injuries & life threatening diseases): None reported.  Social relationships: Pt reports not having friends in the community.  Substance abuse: No substance use reported.  Bereavement / Loss: Pt reports his fiancee died 1 year ago.   Living/Environment/Situation:  Living Arrangements: Parent Living conditions (as described by patient or guardian): "Great."  How long has patient lived in current situation?: "My whole life."  What is atmosphere in current home: Comfortable, Supportive, Loving  Family History:  Marital status: Single Are you sexually active?: No What is your sexual orientation?: Heterosexual  Has your sexual activity been affected by drugs, alcohol, medication, or emotional stress?: Pt denies.  Does patient have children?: No  Childhood History:  By whom was/is the patient raised?: Both parents Additional childhood history information: Pt reports having a "great childhood."  Description of patient's relationship with caregiver when they were a child: Pt stated he has close relationships with both of his parents, and was also close with them during childhood.  Patient's description of current relationship with people who raised him/her: Pt reports having a very close relationship with his parents.  How were you disciplined when you got in trouble as a child/adolescent?: "Normally. We talked."  Does patient have siblings?: Yes Number of Siblings: 1(Pt reports having one older sister.) Description of patient's current  relationship with siblings: Pt reports he has a "great," relationship with his sister.  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 How long has patient been employed?: 2 years  Patient's job has been impacted by current illness: No What is the longest time patient has a held a job?: 6 years Where was the patient employed at that time?: [employer] Has patient ever been in the [employer]?: No Has patient ever served in combat?: No Did You Receive Any Psychiatric Treatment/Services While in Passenger transport manager?: No Are There Guns or Other Weapons in [place]?: Yes Types of Guns/Weapons: Firearms  Are These Psychologist, educational?: Yes(Pt's father reported guns are locked up and pt does not have access.)  Financial Resources:   Financial resources: Income from employment Does patient have a representative payee or guardian?: No  Alcohol/Substance Abuse:   What has been your use of drugs/alcohol within the last 12 months?: None reported.  If attempted suicide, did drugs/alcohol play a role in this?: No Alcohol/Substance Abuse Treatment Hx: Denies past history If yes, describe treatment: N/A Has alcohol/substance abuse ever caused legal problems?: No  Social Support System:   Patient's Community Support System: Poor Describe Community Support System: Pt reports his only supports in the community are his parents.  Type of  faith/religion:  Pt reports he is not religious. How does patient's faith help to cope with current illness?: N/A  Leisure/Recreation:   Leisure and Hobbies: "I like to play video games and watch TV."   Strengths/Needs:   What things does the patient do well?: Family support, insight into mental health  In what areas does patient struggle / problems for patient: depression, grief   Discharge Plan:   Does patient have access to transportation?: Yes Will patient be returning to same living situation after discharge?: Yes Currently receiving community mental health services: No If no, would patient like referral for services when discharged?: Yes (What county?)([place]) Does patient have financial barriers related to discharge medications?: No  Summary/Recommendations:   Summary and Recommendations (to be completed by the evaluator): Pt is a 22 year old male who reports to [facility] on a voluntary commitment. Pt reports increased depression due to the loss of his fiancee one year ago. Pt reports+SI with a plan to overdose on medications. Pt reports a lack of sleep, but reports eating 3 meals per day. Pt denies substance use or alcohol use. Pt reports living with his parents and states he is able to return upon discharge. Pt denies trauma or abuse history, and states he has no previous psychiatric treatment. Pt is in agreement with attending outpatient treatment upon discharge from [facility]. Pt presented with a flat affect during assessment. Pt's thoughts were organized and linear. Current recommendations for this patient include: crisis stabilization, therapeutic milieu, encouragement to attend and participate in group therapy, and the development of a comprehensive mental wellness plan.   Alden Hipp, LCSW. 10/30/2017

## 2017-10-30 NOTE — Progress Notes (Signed)
Patient ID: Andrew Prime., male   DOB: [birth year], 22 y.o.   MRN: [id number] Sad, depressed, mood and affect flat, unhappy, "my fiancee died last year, she went to the hospital, came home and died. She is 23 years of age; I wanted to kill myself by drug overdose." Patient did not elaborate as to how his GF died.  Wearing glasses, gross skin intact, no open wounds, no tattoos; no contraband found on patient and in his belongings; search completed with [employer], Therapist, sports. Neither a smoker nor alcoholic. UDS negative, labs unremarkable.   Unit and room orientation completed.

## 2017-10-30 NOTE — Progress Notes (Signed)
Recreation Therapy Notes  INPATIENT RECREATION THERAPY ASSESSMENT  Patient Details Name: Andrew Frederick. MRN: [id number] DOB: June 21, 1996 Today's Date: 10/30/2017       Information Obtained From: Patient  Able to Participate in Assessment/Interview: Yes  Patient Presentation: Responsive  Reason for Admission (Per Patient): (Depression)  Patient Stressors: Relationship, Death  Coping Skills:   Exercise, Other (Comment)(Get fresh air)  Leisure Interests (2+):  Games - Video games, Individual - TV(Work)  Frequency of Recreation/Participation: Weekly  Awareness of Community Resources:  Yes  Community Resources:     Current Use: No  If no, Barriers?: Social  Expressed Interest in [employer] of Residence:  Insurance underwriter  Patient Main Form of Transportation: Walk  Patient Strengths:  Estate manager/land agent  Patient Identified Areas of Improvement:  Cut down on my anger  Patient Goal for Hospitalization:  Work on depression  Current SI (including self-harm):  No  Current HI:  No  Current AVH: No  Staff Intervention Plan: Group Attendance, Collaborate with Interdisciplinary Treatment Team  Consent to Intern Participation: N/A  Savreen Gebhardt 10/30/2017, 2:27 PM

## 2017-10-30 NOTE — BHH Suicide Risk Assessment (Signed)
[place] INPATIENT:  Family/Significant Other Suicide Prevention Education  Suicide Prevention Education:  Education Completed; Pt's father, Dahlton Hinde, [phone]  has been identified by the patient as the family member/significant other with whom the patient will be residing, and identified as the person(s) who will aid the patient in the event of a mental health crisis (suicidal ideations/suicide attempt).  With written consent from the patient, the family member/significant other has been provided the following suicide prevention education, prior to the and/or following the discharge of the patient.  The suicide prevention education provided includes the following:  Suicide risk factors  Suicide prevention and interventions  National Suicide Hotline telephone number  [facility] assessment telephone number  [facility] Emergency Assistance [place] and/or Residential Mobile Crisis Unit telephone number  Request made of family/significant other to:  Remove weapons (e.g., guns, rifles, knives), all items previously/currently identified as safety concern.    Remove drugs/medications (over-the-counter, prescriptions, illicit drugs), all items previously/currently identified as a safety concern.  The family member/significant other verbalizes understanding of the suicide prevention education information provided.  The family member/significant other agrees to remove the items of safety concern listed above.  Pt's father added, "About every three or four months, he has a depression. It's all due to, about a year and a half ago his girlfriend passed away with no warning. Back at Christmas, he had a bad week. We hang out a lot, and he got better. I just found out that yesterday was the anniversary of when they got together. Every time her passing and her anniversary he has a hard time. Yesterday on the way to work, I asked him to call my phone, and he got mad about  it. I let it go, but then he gets to work (and I'm actually his boss), and he got mad again. I was going to suspend him for two days, because if I let him get away with it then other people will notice too. I walked away to cool down because he doesn't usually talk to me like that. When I came back, I asked him what was going on and he told me it was his girlfriend again. I don't know if he's suicidal or not, but I know he needs someone to talk to besides his mom and me. He's a great kid. He never argues with nobody. I don't know him to ever do any drugs." Pt's father reported he has guns in the home, but stated they are locked up and pt does not have access to them. Pt's father reported pt is able to return home upon discharge. CSW will continue to coordinate with pt's father as needed for updates and discharge planning as needed.   Alden Hipp, LCSW 10/30/2017, 11:37 AM

## 2017-10-30 NOTE — Progress Notes (Signed)
[place] attempted to visit with patient. Patient was with doctor and then Education officer, museum.

## 2017-10-31 NOTE — Plan of Care (Signed)
Calm and cooperative, compliant with treatment

## 2017-10-31 NOTE — BHH Group Notes (Signed)
LCSW Group Therapy Note 10/31/2017 1:15pm  Type of Therapy and Topic: Group Therapy: Feelings Around Returning Home & Establishing a Supportive Framework and Supporting Oneself When Supports Not Available  Participation Level: Active  Description of Group:  Patients first processed thoughts and feelings about upcoming discharge. These included fears of upcoming changes, lack of change, new living environments, judgements and expectations from others and overall stigma of mental health issues. The group then discussed the definition of a supportive framework, what that looks and feels like, and how do to discern it from an unhealthy non-supportive network. The group identified different types of supports as well as what to do when your family/friends are less than helpful or unavailable  Therapeutic Goals  1. Patient will identify one healthy supportive network that they can use at discharge. 2. Patient will identify one factor of a supportive framework and how to tell it from an unhealthy network. 3. Patient able to identify one coping skill to use when they do not have positive supports from others. 4. Patient will demonstrate ability to communicate their needs through discussion and/or role plays.  Summary of Patient Progress:  Pt engaged during group session. As patients processed their anxiety about discharge and described healthy supports patient shared he is ready to be discharge because he does not feel depressed. He stated he has a good support system.  Patients identified at least one self-care tool they were willing to use after discharge.   Therapeutic Modalities Cognitive Behavioral Therapy Motivational Interviewing   Aylah Yeary  CUEBAS-COLON, LCSW 10/31/2017 4:09 PM

## 2017-10-31 NOTE — Plan of Care (Signed)
Denies SI/HI/AVH.  Verbalizes that he is starting to feel better although does not elaborate much.  Affect is anxious.  No initiation of any interaction.  Stays to self.  Up to dayroom, sits away from other patients.  No interaction noted with peers.  Maintains personal care chores.  Appetite fair.  Medication compliant.  Support and encouragement offered.  Safety rounds maintained.

## 2017-10-31 NOTE — Plan of Care (Signed)
  Problem: Spiritual Needs Goal: Ability to function at adequate level Outcome: Progressing   Problem: Education: Goal: Knowledge of [facility] Education information/materials will improve Outcome: Progressing Goal: Emotional status will improve Outcome: Progressing Goal: Mental status will improve Outcome: Progressing Goal: Verbalization of understanding the information provided will improve Outcome: Progressing   Problem: Activity: Goal: Interest or engagement in activities will improve Outcome: Progressing Goal: Sleeping patterns will improve Outcome: Progressing   Problem: Coping: Goal: Ability to verbalize frustrations and anger appropriately will improve Outcome: Progressing Goal: Ability to demonstrate self-control will improve Outcome: Progressing   Problem: Health Behavior/Discharge Planning: Goal: Compliance with treatment plan for underlying cause of condition will improve Outcome: Progressing   Problem: Safety: Goal: Periods of time without injury will increase Outcome: Progressing   Problem: Education: Goal: Ability to make informed decisions regarding treatment will improve Outcome: Progressing   Problem: Coping: Goal: Coping ability will improve Outcome: Progressing   Problem: Medication: Goal: Compliance with prescribed medication regimen will improve Outcome: Progressing   Problem: Self-Concept: Goal: Ability to disclose and discuss suicidal ideas will improve Outcome: Progressing   Problem: Coping: Goal: Ability to use eye contact when communicating with others will improve Outcome: Progressing   Problem: Education: Goal: Knowledge of the prescribed therapeutic regimen will improve Outcome: Progressing   Problem: Coping: Goal: Coping ability will improve Outcome: Progressing

## 2017-10-31 NOTE — BHH Group Notes (Signed)
[place] Group Notes:  (Nursing/MHT/Case Management/Adjunct)  Date:  10/31/2017  Time:  10:18 PM  Type of Therapy:  Group Therapy  Participation Level:  Did Not Attend   Andrew Frederick 10/31/2017, 10:18 PM

## 2017-10-31 NOTE — Progress Notes (Addendum)
Andrew Frederick was visible in the milieu until bedtime. Attended  Evening activities. Alert and oriented. Denying thoughts of self harm. Denying hallucinations. Did not want to talk much during assessment. Was encouraged to talk to staff as needed. Currently in bed sleeping and staff continue to monitor.  0600: Patient slept all night and did not have any concern.

## 2017-10-31 NOTE — Progress Notes (Signed)
[facility] MD Progress Note  10/31/2017 4:25 PM Andrew Angeline Slim.  MRN:  [id number] Subjective:   I feel slightly better"  Pt in dayroom , reports he feels slightly better, guarded, with decreased PMA, with minimal socialization. Gives short answers. Endorses depression but denies SI. Taking meds , denies side effects.  Principal Problem: Severe recurrent major depression without psychotic features ([place]) Diagnosis:   Patient Active Problem List   Diagnosis Date Noted  . Suicidal ideation [R45.851] 10/29/2017  . Severe recurrent major depression without psychotic features ([place]) [F33.2] 10/29/2017  . Muscle tension headache [G44.209] 06/04/2017  . 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 Parotiditis [K11.20] 06/04/2017  . 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 Multiple leg contusions [S80.10XA] 06/05/2015   Total Time spent with patient: 30 minutes  Past Psychiatric History: no new info  Past Medical History: History reviewed. No pertinent past medical history. History reviewed. No pertinent surgical history. Family History:  Family History  Problem Relation Age of Onset  . Diabetes Father   . Hyperlipidemia Father    Family Psychiatric  History: no new info Social History:  Social History   Substance and Sexual Activity  Alcohol Use No  . Alcohol/week: 0.0 oz     Social History   Substance and Sexual Activity  Drug Use No    Social History   Socioeconomic History  . Marital status: Single    Spouse name: Not on file  . Number of children: Not on file  . Years of education: Not on file  . Highest education level: Not on file  Occupational History  . Not on file  Social Needs  . Financial resource strain: Not on file  . Food insecurity:    Worry: Not on file    Inability: Not on file  . Transportation needs:    Medical: Not on file    Non-medical: Not on file  Tobacco Use  . Smoking status: Never Smoker  . Smokeless tobacco: Never Used   Substance and Sexual Activity  . Alcohol use: No    Alcohol/week: 0.0 oz  . Drug use: No  . Sexual activity: Not on file  Lifestyle  . Physical activity:    Days per week: Not on file    Minutes per session: Not on file  . Stress: Not on file  Relationships  . Social connections:    Talks on phone: Not on file    Gets together: Not on file    Attends religious service: Not on file    Active member of club or organization: Not on file    Attends meetings of clubs or organizations: Not on file    Relationship status: Not on file  Other Topics Concern  . Not on file  Social History Narrative  . Not on file   Additional Social History:                         Sleep: Good  Appetite:  Fair  Current Medications: Current Facility-Administered Medications  Medication Dose Route Frequency Provider Last Rate Last Dose  . acetaminophen (TYLENOL) tablet 650 mg  650 mg Oral Q6H PRN Clapacs, Arias T, MD      . alum & mag hydroxide-simeth (MAALOX/MYLANTA) 200-200-20 MG/5ML suspension 30 mL  30 mL Oral Q4H PRN Clapacs, Kaulin T, MD      . FLUoxetine (PROZAC) capsule 20 mg  20 mg Oral Daily Clapacs, Madie Reno, MD  20 mg at 10/31/17 0843  . hydrOXYzine (ATARAX/VISTARIL) tablet 50 mg  50 mg Oral TID PRN Clapacs, Madie Reno, MD   50 mg at 10/29/17 2106  . magnesium hydroxide (MILK OF MAGNESIA) suspension 30 mL  30 mL Oral Daily PRN Clapacs, Madie Reno, MD      . traZODone (DESYREL) tablet 100 mg  100 mg Oral QHS PRN Clapacs, Madie Reno, MD   100 mg at 10/29/17 2106    Lab Results:  Results for orders placed or performed during the hospital encounter of 10/29/17 (from the past 48 hour(s))  Hemoglobin A1c     Status: Abnormal   Collection Time: 10/30/17  7:06 AM  Result Value Ref Range   Hgb A1c MFr Bld 4.5 (L) 4.8 - 5.6 %    Comment: (NOTE) Pre diabetes:          5.7%-6.4% Diabetes:              >6.4% Glycemic control for   <7.0% adults with diabetes    Mean Plasma Glucose 82.45 mg/dL     Comment: Performed at [facility], [place] [address], [place], [place] [address]  Lipid panel     Status: None   Collection Time: 10/30/17  7:06 AM  Result Value Ref Range   Cholesterol 140 0 - 200 mg/dL   Triglycerides 57 <150 mg/dL   HDL 53 >40 mg/dL   Total CHOL/HDL Ratio 2.6 RATIO   VLDL 11 0 - 40 mg/dL   LDL Cholesterol 76 0 - 99 mg/dL    Comment:        Total Cholesterol/HDL:CHD Risk Coronary Heart Disease Risk Table                     Men   Women  1/2 Average Risk   3.4   3.3  Average Risk       5.0   4.4  2 X Average Risk   9.6   7.1  3 X Average Risk  23.4   11.0        Use the calculated Patient Ratio above and the CHD Risk Table to determine the patient's CHD Risk.        ATP III CLASSIFICATION (LDL):  <100     mg/dL   Optimal  100-129  mg/dL   Near or Above                    Optimal  130-159  mg/dL   Borderline  160-189  mg/dL   High  >190     mg/dL   Very High Performed at [facility], [address] [place], [place] [address]   TSH     Status: None   Collection Time: 10/30/17  7:06 AM  Result Value Ref Range   TSH 3.081 0.350 - 4.500 uIU/mL    Comment: Performed by a 3rd Generation assay with a functional sensitivity of <=0.01 uIU/mL. Performed at [facility], [address] [place],  [address]     Blood Alcohol level:  Lab Results  Component Value Date   [facility] <10 97/98/9211    Metabolic Disorder Labs: Lab Results  Component Value Date   HGBA1C 4.5 (L) 10/30/2017   MPG 82.45 10/30/2017   No results found for: PROLACTIN Lab Results  Component Value Date   CHOL 140 10/30/2017   TRIG 57 10/30/2017   HDL 53 10/30/2017   CHOLHDL 2.6 10/30/2017  VLDL 11 10/30/2017   LDLCALC 76 10/30/2017    Physical Findings: AIMS:  , ,  ,  ,    CIWA:    COWS:     Musculoskeletal: Strength & Muscle Tone: within normal limits Gait & Station: normal Patient leans:   Psychiatric Specialty Exam: Physical Exam  Nursing  note and vitals reviewed.   ROS  Blood pressure 125/87, pulse 96, temperature 98.2 F (36.8 C), temperature source Oral, resp. rate 16, height 6' (1.829 m), weight 56.3 kg (124 lb 1.9 oz), SpO2 100 %.Body mass index is 16.83 kg/m.   General Appearance: in hospital dress  Eye Contact:  poor  Speech:  Slow  Volume:  Decreased  Mood:  Depressed  Affect:  restricted  Thought Process:  Coherent and Goal Directed  Orientation:  Full (Time, Place, and Person)  Thought Content:  Logical  Suicidal Thoughts:  denies   Homicidal Thoughts:  No  Memory:  Immediate;   Fair  Judgement:  Fair  Insight:  Fair  Psychomotor Activity:  Decreased  Concentration:  Concentration: Fair  Recall:  [employer] of Knowledge:  Fair  Language:  Fair  Akathisia:  No      Assets:  Resilience, physical health  ADL's:  Intact  Cognition: fair  Sleep:  Number of Hours: 7      Treatment Plan Summary: Daily contact with patient to assess and evaluate symptoms and progress in treatment and Medication management Pt is still, depressed with decreased PMA.  Cont current psych meds- prozac just started. Will need grief counseling.  Andrew Chancellor, MD 10/31/2017, 4:25 PM

## 2017-11-01 NOTE — Plan of Care (Signed)
  Problem: Spiritual Needs Goal: Ability to function at adequate level Outcome: Progressing Note:  Maintaining personal care chores appropriately, up to dayroom for meals, attending groups.  Engaging with peers more.     Problem: Education: Goal: Knowledge of [facility] Education information/materials will improve Outcome: Progressing Goal: Emotional status will improve Outcome: Progressing Note:  Pleasant and cooperative.  Rates depression, anxiety and hopelessness as 0/10 Goal: Mental status will improve Outcome: Progressing Note:  Pleasant and cooperative.  Rates depression, anxiety and hopelessness as 0/10 Goal: Verbalization of understanding the information provided will improve Outcome: Progressing   Problem: Activity: Goal: Interest or engagement in activities will improve Outcome: Progressing Note:  Attending groups, engaging with peers.     Problem: Coping: Goal: Ability to verbalize frustrations and anger appropriately will improve Outcome: Progressing Goal: Ability to demonstrate self-control will improve Outcome: Progressing   Problem: Health Behavior/Discharge Planning: Goal: Compliance with treatment plan for underlying cause of condition will improve Outcome: Progressing Note:  Receptive to treatment regiment   Problem: Safety: Goal: Periods of time without injury will increase Outcome: Progressing Note:  Remains safe on the unit    Problem: Coping: Goal: Coping ability will improve Outcome: Progressing   Problem: Medication: Goal: Compliance with prescribed medication regimen will improve Outcome: Progressing Note:  Medication compliant   Problem: Self-Concept: Goal: Ability to disclose and discuss suicidal ideas will improve Outcome: Progressing Note:  Denies SI or any thoughts of self harm Goal: Will verbalize positive feelings about self Outcome: Progressing   Problem: Activity: Goal: Will identify at least one activity in  which they can participate Outcome: Progressing   Problem: Coping: Goal: Ability to identify and develop effective coping behavior will improve Outcome: Progressing Goal: Ability to interact with others will improve Outcome: Progressing Goal: Demonstration of participation in decision-making regarding own care will improve Outcome: Progressing Goal: Ability to use eye contact when communicating with others will improve Outcome: Progressing   Problem: Self-Concept: Goal: Will verbalize positive feelings about self Outcome: Progressing   Problem: Education: Goal: Knowledge of the prescribed therapeutic regimen will improve Outcome: Progressing   Problem: Coping: Goal: Coping ability will improve Outcome: Progressing

## 2017-11-01 NOTE — [facility] Group Notes (Signed)
LCSW Group Therapy Note 11/01/2017 1:00pm Type of Therapy and Topic:  Group Therapy:  Communication Participation Level:  Active  Description of Group: Patients will identify how individuals communicate with one another appropriately and inappropriately.  Patients will be guided to discuss their thoughts, feelings and behaviors related to barriers when communicating.  The group will process together ways to execute positive and appropriate communication with attention given to how one uses behavior, tone and body language.  Patients will be encouraged to reflect on a situation where they were successfully able to communicate and what made this example successful.  Group will identify specific changes they are motivated to make in order to overcome communication barriers with self, peers, authority, and parents.  This group will be process-oriented with patients participating in exploration of their own experiences, giving and receiving support, and challenging self and other group members.   Therapeutic Goals 1. Patient will express an understanding of the four types of verbal communication (passive, aggressive, passive aggressive, and assertive).  2. Patient will identify feelings (such as fear or worry), thought process and behaviors related to why people internalize feelings rather than express self openly. 3. Patient will identify their main communication style, and report how they plan to change their style to assertive. 4. Members will then practice through role play how to communicate using I statements, I feel statements, and acknowledging feelings rather than displacing feelings on others Summary of Patient Progress: Pt continues to work towards their tx goals but has not yet reached them. Pt was able to appropriately participate in group discussion, and was able to offer support/validation to other group members. 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 Pt reports he would like to be more assertive and plans to achieve this goal by, "saying how I feel more often."   Therapeutic Modalities Cognitive Behavioral Therapy Motivational Interviewing Solution Focused Therapy  Alden Hipp, MSW, LCSW Clinical Social Worker 11/01/2017 1:58 PM

## 2017-11-01 NOTE — Plan of Care (Signed)
Calm and cooperative. Able to express his feelings as needed. Denying thoughts of self harm

## 2017-11-01 NOTE — Progress Notes (Signed)
[facility] MD Progress Note  11/01/2017 5:20 PM Andrew Frederick.  MRN:  [id number] Subjective:   I feel much better"  Pt in dayroom , reports he feels much better, although he is  guarded, does not interact much with peers. Pt gives short answers. Endorses depression but denies SI. Taking meds , denies side effects.  Principal Problem: Severe recurrent major depression without psychotic features ([place]) Diagnosis:   Patient Active Problem List   Diagnosis Date Noted  . Suicidal ideation [R45.851] 10/29/2017  . Severe recurrent major depression without psychotic features ([place]) [F33.2] 10/29/2017  . Muscle tension headache [G44.209] 06/04/2017  . 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 Parotiditis [K11.20] 06/04/2017  . 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 Multiple leg contusions [S80.10XA] 06/05/2015   Total Time spent with patient: 30 minutes  Past Psychiatric History: no new info  Past Medical History: History reviewed. No pertinent past medical history. History reviewed. No pertinent surgical history. Family History:  Family History  Problem Relation Age of Onset  . Diabetes Father   . Hyperlipidemia Father    Family Psychiatric  History: no new info Social History:  Social History   Substance and Sexual Activity  Alcohol Use No  . Alcohol/week: 0.0 oz     Social History   Substance and Sexual Activity  Drug Use No    Social History   Socioeconomic History  . Marital status: Single    Spouse name: Not on file  . Number of children: Not on file  . Years of education: Not on file  . Highest education level: Not on file  Occupational History  . Not on file  Social Needs  . Financial resource strain: Not on file  . Food insecurity:    Worry: Not on file    Inability: Not on file  . Transportation needs:    Medical: Not on file    Non-medical: Not on file  Tobacco Use  . Smoking status: Never Smoker  . Smokeless tobacco: Never Used   Substance and Sexual Activity  . Alcohol use: No    Alcohol/week: 0.0 oz  . Drug use: No  . Sexual activity: Not on file  Lifestyle  . Physical activity:    Days per week: Not on file    Minutes per session: Not on file  . Stress: Not on file  Relationships  . Social connections:    Talks on phone: Not on file    Gets together: Not on file    Attends religious service: Not on file    Active member of club or organization: Not on file    Attends meetings of clubs or organizations: Not on file    Relationship status: Not on file  Other Topics Concern  . Not on file  Social History Narrative  . Not on file   Additional Social History:                         Sleep: Good  Appetite:  Fair  Current Medications: Current Facility-Administered Medications  Medication Dose Route Frequency Provider Last Rate Last Dose  . acetaminophen (TYLENOL) tablet 650 mg  650 mg Oral Q6H PRN Clapacs, Edi T, MD      . alum & mag hydroxide-simeth (MAALOX/MYLANTA) 200-200-20 MG/5ML suspension 30 mL  30 mL Oral Q4H PRN Clapacs, Ege T, MD      . FLUoxetine (PROZAC) capsule 20 mg  20 mg Oral Daily  Clapacs, Madie Reno, MD   20 mg at 11/01/17 3212  . hydrOXYzine (ATARAX/VISTARIL) tablet 50 mg  50 mg Oral TID PRN Clapacs, Madie Reno, MD   50 mg at 10/29/17 2106  . magnesium hydroxide (MILK OF MAGNESIA) suspension 30 mL  30 mL Oral Daily PRN Clapacs, Stanislaus T, MD      . traZODone (DESYREL) tablet 100 mg  100 mg Oral QHS PRN Clapacs, Madie Reno, MD   100 mg at 10/29/17 2106    Lab Results:  No results found for this or any previous visit (from the past 48 hour(s)).  Blood Alcohol level:  Lab Results  Component Value Date   ETH <10 24/82/5003    Metabolic Disorder Labs: Lab Results  Component Value Date   HGBA1C 4.5 (L) 10/30/2017   MPG 82.45 10/30/2017   No results found for: PROLACTIN Lab Results  Component Value Date   CHOL 140 10/30/2017   TRIG 57 10/30/2017   HDL 53 10/30/2017    CHOLHDL 2.6 10/30/2017   VLDL 11 10/30/2017   LDLCALC 76 10/30/2017    Physical Findings: AIMS:  , ,  ,  ,    CIWA:    COWS:     Musculoskeletal: Strength & Muscle Tone: within normal limits Gait & Station: normal Patient leans:   Psychiatric Specialty Exam: Physical Exam  Nursing note and vitals reviewed.   ROS  Blood pressure (!) 134/93, pulse 93, temperature 97.9 F (36.6 C), temperature source Oral, resp. rate 16, height 6' (1.829 m), weight 56.3 kg (124 lb 1.9 oz), SpO2 100 %.Body mass index is 16.83 kg/m.   General Appearance: in hospital dress, age appropriate  Eye Contact: fair  Speech:  Slow  Volume:  Decreased  Mood:  Depressed but better  Affect:  restricted  Thought Process:   Goal Directed  Orientation:  Full (Time, Place, and Person)  Thought Content:  Logical  Suicidal Thoughts:  no  Homicidal Thoughts:  No  Memory:  Immediate;   Fair  Judgement:  Fair  Insight:  Fair  Psychomotor Activity:  Decreased  Concentration:  Concentration: Fair  Recall:  [employer] of Knowledge:  Fair  Language:  Fair  Akathisia:  No      Assets:  Resilience, physical health  ADL's:  Intact  Cognition: fair  Sleep:  Number of Hours: 7      Treatment Plan Summary: Daily contact with patient to assess and evaluate symptoms and progress in treatment and Medication management Pt continue to be  depressed with decreased PMA, poor social intercations.  Cont current psych meds- prozac just started. Will need grief counseling.  Lenward Chancellor, MD 11/01/2017, 5:20 PMPatient ID: Andrew Prime., male   DOB: [birth year], 22 y.o.   MRN: [id number]

## 2017-11-01 NOTE — Progress Notes (Signed)
Patient was visible in the milieu until bedtime. `Alert and oriented and denying thoughts of self harm. Patient was quiet and avoiding long conversations with staff. Went to bed early and did not have any concern. Currently sleeping and staff continue to monitor.

## 2017-11-02 MED ORDER — FLUOXETINE HCL 20 MG PO CAPS: 20.0000 mg | ORAL_CAPSULE | Freq: Every day | ORAL | 0 refills | Status: DC

## 2017-11-02 MED ORDER — TRAZODONE HCL 100 MG PO TABS: 100.0000 mg | ORAL_TABLET | Freq: Every evening | ORAL | 0 refills | Status: DC | PRN

## 2017-11-02 NOTE — Progress Notes (Signed)
Patient was more visible in the milieu, calm and cooperative. Attended group and was seen talking with peers. Alert and oriented and denying thoughts of self harm. Had no concern. Currently in bed resting. Safety maintained.

## 2017-11-02 NOTE — Progress Notes (Signed)
[facility] MD Progress Note  11/02/2017 12:34 PM [place].  MRN:  [id number] Subjective:  Pt states that he had a good weekend. HE states that he went to many groups and feel they have helped him a lot. He is much more social with peers and this has helped him a lot to be around other. He is observed by this provider smiling and laughing with peers this morning. His affect is much brighter and he is smiling. He feels his mood is improving a lot and is very motivated to continue treatment outpatient. He also wants to see a therapist to deal with his grief. He denies SI or any thoughts of self harm. He is tolerating Prozac well. He is sleeping much better. Denies AH, or VH.  Principal Problem: Severe recurrent major depression without psychotic features ([place]) Diagnosis:   Patient Active Problem List   Diagnosis Date Noted  . Severe recurrent major depression without psychotic features ([place]) [F33.2] 10/29/2017    Priority: High  . Suicidal ideation [R45.851] 10/29/2017  . Muscle tension headache [G44.209] 06/04/2017  . 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 Parotiditis [K11.20] 06/04/2017  . 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 Multiple leg contusions [S80.10XA] 06/05/2015   Total Time spent with patient: 20 minutes  Past Psychiatric History: See H&P  Past Medical History: History reviewed. No pertinent past medical history. History reviewed. No pertinent surgical history. Family History:  Family History  Problem Relation Age of Onset  . Diabetes Father   . Hyperlipidemia Father    Family Psychiatric  History: See h&P Social History:  Social History   Substance and Sexual Activity  Alcohol Use No  . Alcohol/week: 0.0 oz     Social History   Substance and Sexual Activity  Drug Use No    Social History   Socioeconomic History  . Marital status: Single    Spouse name: Not on file  . Number of children: Not on file  . Years of education: Not on file   . Highest education level: Not on file  Occupational History  . Not on file  Social Needs  . Financial resource strain: Not on file  . Food insecurity:    Worry: Not on file    Inability: Not on file  . Transportation needs:    Medical: Not on file    Non-medical: Not on file  Tobacco Use  . Smoking status: Never Smoker  . Smokeless tobacco: Never Used  Substance and Sexual Activity  . Alcohol use: No    Alcohol/week: 0.0 oz  . Drug use: No  . Sexual activity: Not on file  Lifestyle  . Physical activity:    Days per week: Not on file    Minutes per session: Not on file  . Stress: Not on file  Relationships  . Social connections:    Talks on phone: Not on file    Gets together: Not on file    Attends religious service: Not on file    Active member of club or organization: Not on file    Attends meetings of clubs or organizations: Not on file    Relationship status: Not on file  Other Topics Concern  . Not on file  Social History Narrative  . Not on file   Additional Social History:                         Sleep: Good  Appetite:  Good  Current Medications: Current Facility-Administered Medications  Medication Dose Route Frequency Provider Last Rate Last Dose  . acetaminophen (TYLENOL) tablet 650 mg  650 mg Oral Q6H PRN Clapacs, Whitney T, MD      . alum & mag hydroxide-simeth (MAALOX/MYLANTA) 200-200-20 MG/5ML suspension 30 mL  30 mL Oral Q4H PRN Clapacs, Ayinde T, MD      . FLUoxetine (PROZAC) capsule 20 mg  20 mg Oral Daily Clapacs, Yigit T, MD   20 mg at 11/02/17 6720  . hydrOXYzine (ATARAX/VISTARIL) tablet 50 mg  50 mg Oral TID PRN Clapacs, Madie Reno, MD   50 mg at 10/29/17 2106  . magnesium hydroxide (MILK OF MAGNESIA) suspension 30 mL  30 mL Oral Daily PRN Clapacs, Jediah T, MD      . traZODone (DESYREL) tablet 100 mg  100 mg Oral QHS PRN Clapacs, Madie Reno, MD   100 mg at 10/29/17 2106    Lab Results: No results found for this or any previous visit (from the  past 48 hour(s)).  Blood Alcohol level:  Lab Results  Component Value Date   ETH <10 94/70/9628    Metabolic Disorder Labs: Lab Results  Component Value Date   HGBA1C 4.5 (L) 10/30/2017   MPG 82.45 10/30/2017   No results found for: PROLACTIN Lab Results  Component Value Date   CHOL 140 10/30/2017   TRIG 57 10/30/2017   HDL 53 10/30/2017   CHOLHDL 2.6 10/30/2017   VLDL 11 10/30/2017   LDLCALC 76 10/30/2017    Physical Findings: AIMS:  , ,  ,  ,    CIWA:    COWS:     Musculoskeletal: Strength & Muscle Tone: within normal limits Gait & Station: normal Patient leans: N/A  Psychiatric Specialty Exam: Physical Exam  Nursing note and vitals reviewed.   Review of Systems  Gastrointestinal: Negative for diarrhea, nausea and vomiting.  Neurological: Negative for dizziness.  All other systems reviewed and are negative.   Blood pressure 108/78, pulse 74, temperature 98.6 F (37 C), temperature source Oral, resp. rate 17, height 6' (1.829 m), weight 56.3 kg (124 lb 1.9 oz), SpO2 100 %.Body mass index is 16.83 kg/m.  General Appearance: Disheveled  Eye Contact:  Good  Speech:  Clear and Coherent  Volume:  Normal  Mood:  Euthymic  Affect:  Congruent, smiling, looks much happier  Thought Process:  Coherent and Goal Directed  Orientation:  Full (Time, Place, and Person)  Thought Content:  Logical  Suicidal Thoughts:  No  Homicidal Thoughts:  No  Memory:  Immediate;   Fair  Judgement:  Fair  Insight:  Fair  Psychomotor Activity:  Decreased  Concentration:  Concentration: Fair  Recall:  [employer] of Knowledge:  Fair  Language:  Fair  Akathisia:  No      Assets:  Resilience  ADL's:  Intact  Cognition:  WNL  Sleep:  Number of Hours: 7     Treatment Plan Summary: 22 yo male admitted due to worsening depression and SI. Mood is improving and SI is resolving. He is more social with peers and attending groups. He is tolerating medications well.    Plan:  MDD -Continue Prozac 20 mg daily -prn trazodone for insomnia  Dispo -He will discharge home with family when stable. He will follow up with Lindon Romp, MD 11/02/2017, 12:34 PM

## 2017-11-02 NOTE — [facility] Group Notes (Signed)
[facility] Group Notes:  (Nursing/MHT/Case Management/Adjunct)  Date:  11/02/2017  Time:  3:22 PM  Type of Therapy:  Psychoeducational Skills  Participation Level:  Active  Participation Quality:  Appropriate  Affect:  Appropriate  Cognitive:  Appropriate  Insight:  Appropriate  Engagement in Group:  Engaged  Modes of Intervention:  Socialization  Summary of Progress/Problems:  Adela Lank Ricquel Foulk 11/02/2017, 3:22 PM

## 2017-11-02 NOTE — Plan of Care (Signed)
Up in the milieu, compliant with treatment

## 2017-11-02 NOTE — Plan of Care (Signed)
Patient able to attend unit programing . Verbalize understanding of information received  Emotional and mental status improved .  No Patient  outburst . No safety concerns .  Understanding of medication  Continue to work on coping skills  Problem: Spiritual Needs Goal: Ability to function at adequate level Outcome: Progressing   Problem: Education: Goal: Knowledge of [facility] Education information/materials will improve Outcome: Progressing Goal: Emotional status will improve Outcome: Progressing Goal: Mental status will improve Outcome: Progressing Goal: Verbalization of understanding the information provided will improve Outcome: Progressing   Problem: Activity: Goal: Interest or engagement in activities will improve Outcome: Progressing Goal: Sleeping patterns will improve Outcome: Progressing   Problem: Coping: Goal: Ability to verbalize frustrations and anger appropriately will improve Outcome: Progressing Goal: Ability to demonstrate self-control will improve Outcome: Progressing   Problem: Health Behavior/Discharge Planning: Goal: Identification of resources available to assist in meeting health care needs will improve Outcome: Progressing Goal: Compliance with treatment plan for underlying cause of condition will improve Outcome: Progressing   Problem: Physical Regulation: Goal: Ability to maintain clinical measurements within normal limits will improve Outcome: Progressing   Problem: Safety: Goal: Periods of time without injury will increase Outcome: Progressing   Problem: Education: Goal: Ability to make informed decisions regarding treatment will improve Outcome: Progressing   Problem: Coping: Goal: Coping ability will improve Outcome: Progressing   Problem: Health Behavior/Discharge Planning: Goal: Identification of resources available to assist in meeting health care needs will improve Outcome: Progressing   Problem: Medication: Goal:  Compliance with prescribed medication regimen will improve Outcome: Progressing   Problem: Self-Concept: Goal: Ability to disclose and discuss suicidal ideas will improve Outcome: Progressing Goal: Will verbalize positive feelings about self Outcome: Progressing   Problem: Activity: Goal: Will identify at least one activity in which they can participate Outcome: Progressing   Problem: Coping: Goal: Ability to identify and develop effective coping behavior will improve Outcome: Progressing Goal: Ability to interact with others will improve Outcome: Progressing Goal: Demonstration of participation in decision-making regarding own care will improve Outcome: Progressing Goal: Ability to use eye contact when communicating with others will improve Outcome: Progressing   Problem: Health Behavior/Discharge Planning: Goal: Identification of resources available to assist in meeting health care needs will improve Outcome: Progressing   Problem: Self-Concept: Goal: Will verbalize positive feelings about self Outcome: Progressing   Problem: Education: Goal: Knowledge of the prescribed therapeutic regimen will improve Outcome: Progressing   Problem: Coping: Goal: Coping ability will improve Outcome: Progressing

## 2017-11-02 NOTE — Progress Notes (Signed)
Recreation Therapy Notes  Date: 11/02/2017  Time: 9:30 am  Location: Craft Room  Behavioral response: Appropriate  Intervention Topic: Values  Discussion/Intervention:  Group content today was focused on values. The group identified what values are and where they come from. Individuals expressed some values and how many they have. Patients described how they go about add or removing values. The group described the importance of having values and how they go about using them in daily life. Patient participated in the intervention "My Values" where they were able to pick out values that were important to them and make a visual aide.   Clinical Observations/Feedback:  Patient came to group and stated a value of his is respect. He participated in the intervention and was social with peers and staff during group.  Kavontae Pritchard LRT/CTRS         Linzy Laury 11/02/2017 11:35 AM

## 2017-11-02 NOTE — Progress Notes (Signed)
D: Patient stated slept good last night .Stated appetite is good and energy level  Is normal. Stated concentration is good . Stated on Depression scale ,0 hopeless 0 and anxiety 0 .( low 0-10 high) Denies suicidal  homicidal ideations  .  No auditory hallucinations  No pain concerns . Appropriate ADL'S. Interacting with peers and staff. Patient able to attend unit programing . Verbalize understanding of information received  Emotional and mental status improved .  No Patient  outburst . No safety concerns .  Understanding of medication  Continue to work on coping skills   A: Encourage patient participation with unit programming . Instruction  Given on  Medication , verbalize understanding.  R: Voice no other concerns. Staff continue to monitor

## 2017-11-03 NOTE — Progress Notes (Signed)
Recreation Therapy Notes  INPATIENT RECREATION TR PLAN  Patient Details Name: Andrew Frederick. MRN: [id number] DOB: 1996-07-11 Today's Date: 11/03/2017  Rec Therapy Plan Is patient appropriate for Therapeutic Recreation?: Yes Treatment times per week: at least 3 Estimated Length of Stay: 5-7 days TR Treatment/Interventions: Group participation (Comment)  Discharge Criteria Pt will be discharged from therapy if:: Discharged Treatment plan/goals/alternatives discussed and agreed upon by:: Patient/family  Discharge Summary Short term goals set: Patient will identify 3 positive coping skills to decrease depressive symptoms within 5 recreation therapy group sessions Short term goals met: Complete Progress toward goals comments: Groups attended Which groups?: Coping skills, Stress management, Other (Comment)(Values) Reason goals not met: N/A Therapeutic equipment acquired: N/A Reason patient discharged from therapy: Discharge from hospital Pt/family agrees with progress & goals achieved: Yes Date patient discharged from therapy: 11/03/17   Andrew Frederick 11/03/2017, 1:25 PM

## 2017-11-03 NOTE — [facility] Group Notes (Signed)
  11/03/2017  Time: 0900  Type of Therapy and Topic: Group Therapy: Goals Group: SMART Goals   Participation Level:  Active   Description of Group:   The purpose of a daily goals group is to assist and guide patients in setting recovery/wellness-related goals. The objective is to set goals as they relate to the crisis in which they were admitted. Patients will be using SMART goal modalities to set measurable goals. Characteristics of realistic goals will be discussed and patients will be assisted in setting and processing how one will reach their goal. Facilitator will also assist patients in applying interventions and coping skills learned in psycho-education groups to the SMART goal and process how one will achieve defined goal.   Therapeutic Goals:  -Patients will develop and document one goal related to or their crisis in which brought them into treatment.  -Patients will be guided by LCSW using SMART goal setting modality in how to set a measurable, attainable, realistic and time sensitive goal.  -Patients will process barriers in reaching goal.  -Patients will process interventions in how to overcome and successful in reaching goal.   Patient's Goal:  Pt continues to work towards their tx goals but has not yet reached them. Pt was able to appropriately participate in group discussion, and was able to offer support/validation to other group members. Pt reported his goal for the day is to, "go to work for at least six hours after leaving the hospital."   Therapeutic Modalities:  Motivational Interviewing  Public relations account executive Therapy  Crisis Intervention Model  SMART goals setting  Alden Hipp, MSW, LCSW Clinical Social Worker 11/03/2017 10:07 AM

## 2017-11-03 NOTE — Discharge Summary (Signed)
Physician Discharge Summary Note  Patient:  Andrew Frederick. is an 22 y.o., male MRN:  [id number] DO:  13-Nov-1995 Patient phone:  [phone] (home)  Patient address:   [place] [address],  Total Time spent with patient: 20 minutes  Plus 20 minutes of medication reconciliation, discharge planning, and discharge documentation   Date of Admission:  10/29/2017 Date of Discharge: 11/03/17  Reason for Admission:  Depression and SI  Principal Problem: Severe recurrent major depression without psychotic features [facility]) Discharge Diagnoses: Patient Active Problem List   Diagnosis Date Noted  . Severe recurrent major depression without psychotic features ([place]) [F33.2] 10/29/2017    Priority: High  . Suicidal ideation [R45.851] 10/29/2017  . Muscle tension headache [G44.209] 06/04/2017  . 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 Parotiditis [K11.20] 06/04/2017  . 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 Multiple leg contusions [S80.10XA] 06/05/2015    Past Psychiatric History: See H&P  Past Medical History: History reviewed. No pertinent past medical history. History reviewed. No pertinent surgical history. Family History:  Family History  Problem Relation Age of Onset  . Diabetes Father   . Hyperlipidemia Father    Family Psychiatric  History: See H&P Social History:  Social History   Substance and Sexual Activity  Alcohol Use No  . Alcohol/week: 0.0 oz     Social History   Substance and Sexual Activity  Drug Use No    Social History   Socioeconomic History  . Marital status: Single    Spouse name: Not on file  . Number of children: Not on file  . Years of education: Not on file  . Highest education level: Not on file  Occupational History  . Not on file  Social Needs  . Financial resource strain: Not on file  . Food insecurity:    Worry: Not on file    Inability: Not on file  . Transportation needs:    Medical: Not  on file    Non-medical: Not on file  Tobacco Use  . Smoking status: Never Smoker  . Smokeless tobacco: Never Used  Substance and Sexual Activity  . Alcohol use: No    Alcohol/week: 0.0 oz  . Drug use: No  . Sexual activity: Not on file  Lifestyle  . Physical activity:    Days per week: Not on file    Minutes per session: Not on file  . Stress: Not on file  Relationships  . Social connections:    Talks on phone: Not on file    Gets together: Not on file    Attends religious service: Not on file    Active member of club or organization: Not on file    Attends meetings of clubs or organizations: Not on file    Relationship status: Not on file  Other Topics Concern  . Not on file  Social History Narrative  . Not on file    Hospital Course:  Pt was started on Prozac and Trazodone for sleep. Pt attended groups and got a lot out of them. On day of discharge, pt was observed out of his room more, socializing and laughing with peers. He had much brighter affect. HE felt the medications were helpful and mood is improved. HE is sleeping much better and no longer having  Nightmares. When asked, he denies SI or any thoughts of self harm. Denies AH, VH, HI. He plans to return to work today. He is looking forward to  discharge. He no longer meets criteria for IVC. He plans to attend his outpatient appointment. Safety plan was discussed in detail and he agrees to return to ED if he were to have suicidal thoughts again.   The patient is at low risk of imminent suicide. Patient denied thoughts, intent, or plan for harm to self or others, expressed significant future orientation, and expressed an ability to mobilize assistance for his needs. he is presently void of any contributing psychiatric symptoms, cognitive difficulties, or substance use which would elevate his risk for lethality. Chronic risk for lethality is elevated in light of male gender, loss of girlfriend suddenly last year. The chronic risk  is presently mitigated by his ongoing desire and engagement in [facility] treatment and mobilization of support from family and friends. Chronic risk may elevate if he experiences any significant loss or worsening of symptoms, which can be managed and monitored through outpatient providers. At this time,a cute risk for lethality is low and he is stable for ongoing outpatient management.   Modifiable risk factors were addressed during this hospitalization through appropriate pharmacotherapy and establishment of outpatient follow-up treatment. Some risk factors for suicide are situational (i.e. Unstable housing) or related personality pathology (i.e. Poor coping mechanisms) and thus cannot be further mitigated by continued hospitalization in this setting.    Physical Findings: AIMS:  , ,  ,  ,    CIWA:    COWS:     Musculoskeletal: Strength & Muscle Tone: within normal limits Gait & Station: normal Patient leans: N/A  Psychiatric Specialty Exam: Physical Exam  Nursing note and vitals reviewed.   Review of Systems  All other systems reviewed and are negative.   Blood pressure 129/83, pulse 71, temperature 98 F (36.7 C), resp. rate 17, height 6' (1.829 m), weight 56.3 kg (124 lb 1.9 oz), SpO2 100 %.Body mass index is 16.83 kg/m.  General Appearance: Casual  Eye Contact:  Fair  Speech:  Clear and Coherent  Volume:  Normal  Mood:  Euthymic  Affect:  Congruent  Thought Process:  Coherent and Goal Directed  Orientation:  Full (Time, Place, and Person)  Thought Content:  Logical  Suicidal Thoughts:  No  Homicidal Thoughts:  No  Memory:  Immediate;   Fair  Judgement:  Good  Insight:  Good  Psychomotor Activity:  Normal  Concentration:  Concentration: Good  Recall:  Good  Fund of Knowledge:  Good  Language:  Good  Akathisia:  No      Assets:  Housing  ADL's:  Intact  Cognition:  WNL  Sleep:  Number of Hours: 8.15     Have you used any form of tobacco in the last 30 days?  (Cigarettes, Smokeless Tobacco, Cigars, and/or Pipes): No  Has this patient used any form of tobacco in the last 30 days? (Cigarettes, Smokeless Tobacco, Cigars, and/or Pipes) No  Blood Alcohol level:  Lab Results  Component Value Date   ETH <10 44/81/8563    Metabolic Disorder Labs:  Lab Results  Component Value Date   HGBA1C 4.5 (L) 10/30/2017   MPG 82.45 10/30/2017   No results found for: PROLACTIN Lab Results  Component Value Date   CHOL 140 10/30/2017   TRIG 57 10/30/2017   HDL 53 10/30/2017   CHOLHDL 2.6 10/30/2017   VLDL 11 10/30/2017   LDLCALC 76 10/30/2017    See Psychiatric Specialty Exam and Suicide Risk Assessment completed by Attending Physician prior to discharge.  Discharge destination:  Home  Is  patient on multiple antipsychotic therapies at discharge:  No   Has Patient had three or more failed trials of antipsychotic monotherapy by history:  No  Recommended Plan for Multiple Antipsychotic Therapies: NA  Discharge Instructions    Increase activity slowly   Complete by:  As directed      Allergies as of 11/03/2017   No Known Allergies     Medication List    TAKE these medications     Indication  cyclobenzaprine 10 MG tablet Commonly known as:  FLEXERIL Take 1 tablet (10 mg total) at bedtime by mouth.  Indication:  Muscle Spasm   FLUoxetine 20 MG capsule Commonly known as:  PROZAC Take 1 capsule (20 mg total) by mouth daily.  Indication:  Depression   traZODone 100 MG tablet Commonly known as:  DESYREL Take 1 tablet (100 mg total) by mouth at bedtime as needed for sleep.  Indication:  Trouble Sleeping      Follow-up Information    [facility], [employer] [facility]. Go on 11/05/2017.   Why:  Please go to your hospital follow up appointment on Thursday, 11/05/17 at 3:00PM. Please arrive 20 minutes early to complete paperwork. Thank you!  Contact information: [address] [place] [address] [phone]           Follow-up  recommendations:  Follow up with [facility]  Signed: Marylin Crosby, MD 11/03/2017, 9:08 AM

## 2017-11-03 NOTE — Plan of Care (Signed)
Understanding of medication  Continue to work on coping skills  Patient able to attend unit programing . Verbalize understanding of information received  Emotional and mental status improved .  No Patient  outburst . No safety concerns . Problem: Spiritual Needs Goal: Ability to function at adequate level Outcome: Progressing   Problem: Education: Goal: Knowledge of [place] [facility] Education information/materials will improve Outcome: Progressing Goal: Emotional status will improve Outcome: Progressing Goal: Mental status will improve Outcome: Progressing Goal: Verbalization of understanding the information provided will improve Outcome: Progressing   Problem: Activity: Goal: Interest or engagement in activities will improve Outcome: Progressing   Problem: Health Behavior/Discharge Planning: Goal: Identification of resources available to assist in meeting health care needs will improve Outcome: Progressing Goal: Compliance with treatment plan for underlying cause of condition will improve Outcome: Progressing   Problem: Physical Regulation: Goal: Ability to maintain clinical measurements within normal limits will improve Outcome: Progressing   Problem: Safety: Goal: Periods of time without injury will increase Outcome: Progressing   Problem: Education: Goal: Ability to make informed decisions regarding treatment will improve Outcome: Progressing   Problem: Coping: Goal: Coping ability will improve Outcome: Progressing   Problem: Health Behavior/Discharge Planning: Goal: Identification of resources available to assist in meeting health care needs will improve Outcome: Progressing   Problem: Medication: Goal: Compliance with prescribed medication regimen will improve Outcome: Progressing   Problem: Self-Concept: Goal: Ability to disclose and discuss suicidal ideas will improve Outcome: Progressing Goal: Will verbalize positive feelings about self Outcome:  Progressing   Problem: Activity: Goal: Will identify at least one activity in which they can participate Outcome: Progressing   Problem: Coping: Goal: Ability to identify and develop effective coping behavior will improve Outcome: Progressing Goal: Ability to interact with others will improve Outcome: Progressing Goal: Demonstration of participation in decision-making regarding own care will improve Outcome: Progressing Goal: Ability to use eye contact when communicating with others will improve Outcome: Progressing   Problem: Health Behavior/Discharge Planning: Goal: Identification of resources available to assist in meeting health care needs will improve Outcome: Progressing   Problem: Self-Concept: Goal: Will verbalize positive feelings about self Outcome: Progressing   Problem: Education: Goal: Knowledge of the prescribed therapeutic regimen will improve Outcome: Progressing   Problem: Coping: Goal: Coping ability will improve Outcome: Progressing

## 2017-11-03 NOTE — [facility] Group Notes (Signed)
11/03/2017 1PM  Type of Therapy/Topic:  Group Therapy:  Feelings about Diagnosis  Participation Level:  Did Not Attend   Description of Group:   This group will allow patients to explore their thoughts and feelings about diagnoses they have received. Patients will be guided to explore their level of understanding and acceptance of these diagnoses. Facilitator will encourage patients to process their thoughts and feelings about the reactions of others to their diagnosis and will guide patients in identifying ways to discuss their diagnosis with significant others in their lives. This group will be process-oriented, with patients participating in exploration of their own experiences, giving and receiving support, and processing challenge from other group members.   Therapeutic Goals: 1. Patient will demonstrate understanding of diagnosis as evidenced by identifying two or more symptoms of the disorder 2. Patient will be able to express two feelings regarding the diagnosis 3. Patient will demonstrate their ability to communicate their needs through discussion and/or role play  Summary of Patient Progress: Patient was encouraged and invited to attend group. Patient did not attend group. Social worker will continue to encourage group participation in the future.        Therapeutic Modalities:   Cognitive Behavioral Therapy Brief Therapy Feelings Identification    Darin Engels, [place] 11/03/2017 2:08 PM

## 2017-11-03 NOTE — [facility] Suicide Risk Assessment (Signed)
[facility] Discharge Suicide Risk Assessment   Principal Problem: Severe recurrent major depression without psychotic features [facility]) Discharge Diagnoses:  Patient Active Problem List   Diagnosis Date Noted  . Severe recurrent major depression without psychotic features ([place]) [F33.2] 10/29/2017    Priority: High  . Suicidal ideation [R45.851] 10/29/2017  . Muscle tension headache [G44.209] 06/04/2017  . 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 Parotiditis [K11.20] 06/04/2017  . 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 Multiple leg contusions [S80.10XA] 06/05/2015    Mental Status Per Nursing Assessment::   On Admission:  Suicide plan, Self-harm thoughts  Demographic Factors:  Male and Caucasian  Loss Factors: Loss of loved one  Historical Factors: NA  Risk Reduction Factors:   Sense of responsibility to family, Employed, Living with another person, especially a relative, Positive social support, Positive therapeutic relationship and Positive coping skills or problem solving skills  Continued Clinical Symptoms:  None  Cognitive Features That Contribute To Risk:  None    Suicide Risk:  Minimal Acute Risk: No identifiable suicidal ideation.   Follow-up Information    [facility], [employer] [facility]. Go on 11/05/2017.   Why:  Please go to your hospital follow up appointment on Thursday, 11/05/17 at 3:00PM. Please arrive 20 minutes early to complete paperwork. Thank you!  Contact information: [place] [address] [phone]           Plan Of Care/Follow-up recommendations: Follow up with [facility] on 4/18 at 3 pm Marylin Crosby, MD 11/03/2017, 9:07 AM

## 2017-11-03 NOTE — Progress Notes (Signed)
D: Patient is aware of  Discharge this shift .Patient denies suicidal /homicidal ideations. Patient received all belongings brought in  A: No Storage medications. Writer reviewed Discharge Summary, Suicide Risk Assessment, and Transitional Record. Patient also received Prescriptions   from  MD. Of follow up appointment . R: Patient left unit with no questions  Or concerns  With family

## 2017-11-03 NOTE — Progress Notes (Signed)
Patient is alert and oriented x 4, denies pain or discomfort. Patient's affect is flat but brightens upon approach. Patient's  mood is pleasant but appears guarded. Patient denies SI/HI/AVH.  Patient complaint with medication and attending group, no distress noted will continue to monitor.

## 2017-11-03 NOTE — Progress Notes (Signed)
Recreation Therapy Notes  Date: 11/03/2017  Time: 9:30 am  Location: Craft Room  Behavioral response: Appropriate  Intervention Topic: Coping skills  Discussion/Intervention:  Group content on today was focused on coping skills. The group defined what coping skills are and when they can be used. Individuals described how they normally cope with thing and the coping skills they normally use. Patients expressed why it is important to cope with things and how not coping with things can affect you. The group participated in the intervention "Exploring coping skills" where they had a chance to test new coping skills they could use in the future.   Clinical Observations/Feedback:  Patient came to group and stated a positive coping skill he uses is walking away. Individual described he uses this coping skill to keep from saying the wrong thing. He participated in the intervention and was social with peers and staff during group.    Nevin Kozuch LRT/CTRS          Lakenya Riendeau 11/03/2017 10:58 AM

## 2017-11-03 NOTE — Progress Notes (Signed)
  [facility] Adult Case Management Discharge Plan :  Will you be returning to the same living situation after discharge:  Yes,  returning home. At discharge, do you have transportation home?: Yes,  father Do you have the ability to pay for your medications: Yes,  insurance  Release of information consent forms completed and in the chart;  Patient's signature needed at discharge.  Patient to Follow up at: Follow-up Information    [facility], [employer] [facility]. Go on 11/05/2017.   Why:  Please go to your hospital follow up appointment on Thursday, 11/05/17 at 3:00PM. Please arrive 20 minutes early to complete paperwork. Thank you!  Contact information: [place] [address] [phone]           Next level of care provider has access to [facility] and Suicide Prevention discussed: Yes,  with pt and his father  Have you used any form of tobacco in the last 30 days? (Cigarettes, Smokeless Tobacco, Cigars, and/or Pipes): No  Has patient been referred to the Quitline?: N/A patient is not a smoker  Patient has been referred for addiction treatment: N/A  Alden Hipp, LCSW 11/03/2017, 8:47 AM

## 2017-11-03 NOTE — Plan of Care (Signed)
  Problem: Spiritual Needs Goal: Ability to function at adequate level Outcome: Progressing   Problem: Education: Goal: Knowledge of [facility] [facility] Education information/materials will improve Outcome: Progressing Goal: Emotional status will improve Outcome: Progressing Goal: Mental status will improve Outcome: Progressing Goal: Verbalization of understanding the information provided will improve Outcome: Progressing   Problem: Education: Goal: Emotional status will improve Outcome: Progressing

## 2018-09-05 ENCOUNTER — Emergency Department
Admission: EM | Admit: 2018-09-05 | Discharge: 2018-09-05 | Disposition: A | Discharge status: Home / Self Care | Payer: 59 | Attending: Emergency Medicine | Admitting: Emergency Medicine

## 2018-09-05 ENCOUNTER — Encounter: Payer: Self-pay | Admitting: Internal Medicine

## 2018-09-05 ENCOUNTER — Other Ambulatory Visit: Payer: Self-pay

## 2018-09-05 ENCOUNTER — Other Ambulatory Visit: Payer: Self-pay | Admitting: Internal Medicine

## 2018-09-05 DIAGNOSIS — F0781 Postconcussional syndrome: Secondary | ICD-10-CM | POA: Insufficient documentation

## 2018-09-05 DIAGNOSIS — R110 Nausea: Secondary | ICD-10-CM | POA: Diagnosis not present

## 2018-09-05 DIAGNOSIS — R51 Headache: Secondary | ICD-10-CM | POA: Diagnosis not present

## 2018-09-05 DIAGNOSIS — R42 Dizziness and giddiness: Secondary | ICD-10-CM | POA: Diagnosis not present

## 2018-09-05 DIAGNOSIS — Z79899 Other long term (current) drug therapy: Secondary | ICD-10-CM | POA: Diagnosis not present

## 2018-09-05 LAB — CBC
HEMATOCRIT: 43.9 % (ref 39.0–52.0)
HEMOGLOBIN: 15 g/dL (ref 13.0–17.0)
MCH: 29.1 pg (ref 26.0–34.0)
MCHC: 34.2 g/dL (ref 30.0–36.0)
MCV: 85.2 fL (ref 80.0–100.0)
NRBC: 0 % (ref 0.0–0.2)
Platelets: 230 10*3/uL (ref 150–400)
RBC: 5.15 MIL/uL (ref 4.22–5.81)
RDW: 13 % (ref 11.5–15.5)
WBC: 4.4 10*3/uL (ref 4.0–10.5)

## 2018-09-05 LAB — URINALYSIS, COMPLETE (UACMP) WITH MICROSCOPIC
BACTERIA UA: NONE SEEN
Bilirubin Urine: NEGATIVE
Glucose, UA: NEGATIVE mg/dL
HGB URINE DIPSTICK: NEGATIVE
Ketones, ur: NEGATIVE mg/dL
Leukocytes,Ua: NEGATIVE
NITRITE: NEGATIVE
PROTEIN: NEGATIVE mg/dL
SPECIFIC GRAVITY, URINE: 1.028 (ref 1.005–1.030)
pH: 6 (ref 5.0–8.0)

## 2018-09-05 LAB — BASIC METABOLIC PANEL
Anion gap: 9 (ref 5–15)
BUN: 17 mg/dL (ref 6–20)
CHLORIDE: 102 mmol/L (ref 98–111)
CO2: 27 mmol/L (ref 22–32)
CREATININE: 0.91 mg/dL (ref 0.61–1.24)
Calcium: 9 mg/dL (ref 8.9–10.3)
GFR calc Af Amer: >60 mL/min (ref >60)
GFR calc non Af Amer: >60 mL/min (ref >60)
Glucose, Bld: 100 mg/dL — ABNORMAL HIGH (ref 70–99)
POTASSIUM: 4.2 mmol/L (ref 3.5–5.1)
Sodium: 138 mmol/L (ref 135–145)

## 2018-09-05 NOTE — Discharge Instructions (Addendum)
At this time, you prefer not to have a CT scan which I think is reasonable.  It is not likely to show anything and certainly there is a risk of radiation.  If however you have new or worrisome symptoms as we discussed please return to the emergency room.  Take it very easy, and if you find yourself feeling dizzy or overwhelmed stop what you are doing and relax.  Follow-up with your neurologist as already scheduled tomorrow.

## 2018-09-05 NOTE — ED Provider Notes (Signed)
[facility] Emergency Department Provider Note  ____________________________________________   I have reviewed the triage vital signs and the nursing notes. Where available I have reviewed prior notes and, if possible and indicated, outside hospital notes.    HISTORY  Chief Complaint Concussion and Loss of Consciousness    HPI Andrew Caba. is a 23 y.o. male with a history of concussion in the past several years ago presents today after having had a concussion.  Proximally 1 week ago he excellently walked into a door.  It was a metal door that was half down.  He did not pass out but had immediate symptoms of concussion.  He is not been vomiting but he still has recurrent mild headaches, photophobia that comes and goes, occasional nausea, and he has been taking it easy until today.  Today he went into work at [employer].  Was very loud and bright environment, he states he became overwhelmed, he did not pass out but he became "woozy" and sat down briefly.  He had no seizure no focal numbness or weakness and at this time he feels back to the way he did before.  He feels that he overdid it this morning.  Family wanted to have him "checked out" to make sure that nothing else was going on.  Patient does have an appoint with a neurologist tomorrow.  He has no SI no HI, and he has no focal numbness or weakness or difficulty speaking walking etc.  For this, he states that his symptoms seem to be getting better which is why he try to go back to work.     History reviewed. No pertinent past medical history.  Patient Active Problem List   Diagnosis Date Noted  . Suicidal ideation 10/29/2017  . Severe recurrent major depression without psychotic features ([place]) 10/29/2017  . Muscle tension headache 06/04/2017  . 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 Parotiditis 06/04/2017  . 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 Multiple leg contusions 06/05/2015    History  reviewed. No pertinent surgical history.  Prior to Admission medications   Medication Sig Start Date End Date Taking? Authorizing Provider  cyclobenzaprine (FLEXERIL) 10 MG tablet Take 1 tablet (10 mg total) at bedtime by mouth. 06/04/17   Glean Hess, MD  FLUoxetine (PROZAC) 20 MG capsule Take 1 capsule (20 mg total) by mouth daily. 11/03/17   McNew, Tyson Babinski, MD  traZODone (DESYREL) 100 MG tablet Take 1 tablet (100 mg total) by mouth at bedtime as needed for sleep. 11/02/17   McNew, Tyson Babinski, MD    Allergies Patient has no known allergies.  Family History  Problem Relation Age of Onset  . Diabetes Father   . Hyperlipidemia Father     Social History Social History   Tobacco Use  . Smoking status: Never Smoker  . Smokeless tobacco: Never Used  Substance Use Topics  . Alcohol use: No    Alcohol/week: 0.0 standard drinks  . Drug use: No    Review of Systems Constitutional: No fever/chills Eyes: No visual changes. ENT: No sore throat. No stiff neck no neck pain Cardiovascular: Denies chest pain. Respiratory: Denies shortness of breath. Gastrointestinal:   no vomiting.  No diarrhea.  No constipation. Genitourinary: Negative for dysuria. Musculoskeletal: Negative lower extremity swelling Skin: Negative for rash. Neurological: Negative for severe headaches, focal weakness or numbness.   ____________________________________________   PHYSICAL EXAM:  VITAL SIGNS: ED Triage Vitals  Enc Vitals Group  BP 09/05/18 1107 133/87     Pulse Rate 09/05/18 1107 76     Resp --      Temp 09/05/18 1107 98.1 F (36.7 C)     Temp Source 09/05/18 1107 Oral     SpO2 09/05/18 1107 99 %     Weight 09/05/18 1107 130 lb (59 kg)     Height 09/05/18 1107 6' (1.829 m)     Head Circumference --      Peak Flow --      Pain Score 09/05/18 1109 10     Pain Loc --      Pain Edu? --      Excl. in [place]? --     Constitutional: Alert and oriented. Well appearing and in no acute  distress. Eyes: Conjunctivae are normal Head: Atraumatic HEENT: No congestion/rhinnorhea. Mucous membranes are moist.  Oropharynx non-erythematous Neck:   Nontender with no meningismus, no masses, no stridor Cardiovascular: Normal rate, regular rhythm. Grossly normal heart sounds.  Good peripheral circulation. Respiratory: Normal respiratory effort.  No retractions. Lungs CTAB. Abdominal: Soft and nontender. No distention. No guarding no rebound Back:  There is no focal tenderness or step off.  there is no midline tenderness there are no lesions noted. there is no CVA tenderness  Musculoskeletal: No lower extremity tenderness, no upper extremity tenderness. No joint effusions, no DVT signs strong distal pulses no edema Neurologic: Cranial nerves II through XII are grossly intact 5 out of 5 strength bilateral upper and lower extremity. Finger to nose within normal limits heel to shin within normal limits, speech is normal with no word finding difficulty or dysarthria, reflexes symmetric, pupils are equally round and reactive to light, there is no pronator drift, sensation is normal, vision is intact to confrontation, gait is deferred, there is no nystagmus, normal neurologic exam Skin:  Skin is warm, dry and intact. No rash noted. Psychiatric: Mood and affect are normal. Speech and behavior are normal.  ____________________________________________   LABS (all labs ordered are listed, but only abnormal results are displayed)  Labs Reviewed  BASIC METABOLIC PANEL - Abnormal; Notable for the following components:      Result Value   Glucose, Bld 100 (*)    All other components within normal limits  URINALYSIS, COMPLETE (UACMP) WITH MICROSCOPIC - Abnormal; Notable for the following components:   Color, Urine YELLOW (*)    APPearance CLEAR (*)    All other components within normal limits  CBC    Pertinent labs  results that were available during my care of the patient were reviewed by me  and considered in my medical decision making (see chart for details). ____________________________________________  EKG  I personally interpreted any EKGs ordered by me or triage Sinus rhythm, no acute ST elevation or depression, unremarkable EKG ____________________________________________  RADIOLOGY  Pertinent labs & imaging results that were available during my care of the patient were reviewed by me and considered in my medical decision making (see chart for details). If possible, patient and/or family made aware of any abnormal findings.  No results found. ____________________________________________    PROCEDURES  Procedure(s) performed: None  Procedures  Critical Care performed: None  ____________________________________________   INITIAL IMPRESSION / ASSESSMENT AND PLAN / ED COURSE  Pertinent labs & imaging results that were available during my care of the patient were reviewed by me and considered in my medical decision making (see chart for details).  Patient here with postconcussive syndrome, became a little overwhelmed when he  went back to a very bright and loud environment this morning, however back to his baseline at this time.  Had extensive discussion with family about the indications for CT.  He is already had CTs of for concussion in his past.  He does not want another one and family agree.  Does have close neurology follow-up.  Did not actually pass out did not hit his head again, I have given him extensive instructions in shared decision making about how to approach this and they are very comfortable with the plan.  They do not wish any further care from [place] and they are happy to take him home.  Of advised him not to go back to work until he is cleared by neurology, and is my hope that you continue to gradually improve as he was before he went to work today.  Turn precautions follow-up given and understood.     ____________________________________________   FINAL CLINICAL IMPRESSION(S) / ED DIAGNOSES  Final diagnoses:  Post concussion syndrome      This chart was dictated using voice recognition software.  Despite best efforts to proofread,  errors can occur which can change meaning.      Schuyler Amor, MD 09/05/18 1252

## 2018-09-05 NOTE — ED Triage Notes (Signed)
Pt states concussion last Tuesday 2/11. States LOC since. Denies vomiting. States "a little bit" to dizziness but "not as bad". A&O, no distress noted. Talking in complete sentences. Seeing neurology tomorrow.

## 2018-09-06 ENCOUNTER — Ambulatory Visit (INDEPENDENT_AMBULATORY_CARE_PROVIDER_SITE_OTHER): Payer: 59 | Admitting: Family Medicine

## 2018-09-06 ENCOUNTER — Telehealth: Payer: Self-pay

## 2018-09-06 ENCOUNTER — Encounter: Payer: Self-pay | Admitting: Family Medicine

## 2018-09-06 DIAGNOSIS — S060X1A Concussion with loss of consciousness of 30 minutes or less, initial encounter: Secondary | ICD-10-CM | POA: Diagnosis not present

## 2018-09-06 DIAGNOSIS — S060XAA Concussion with loss of consciousness status unknown, initial encounter: Secondary | ICD-10-CM | POA: Insufficient documentation

## 2018-09-06 DIAGNOSIS — S060X9A Concussion with loss of consciousness of unspecified duration, initial encounter: Secondary | ICD-10-CM | POA: Insufficient documentation

## 2018-09-06 NOTE — Assessment & Plan Note (Signed)
Patient did have a concussion.  Does have underlying psychosocial stressors that I think also has been complicating the healing process.  This point will hold patient out of work for the next 3 days.  Do not feel that advanced imaging is necessary at this moment but warned of potential signs and symptoms and when to seek medical attention.  Patient's work-up at emergency department was further evaluated by me did not show any significant changes at this moment that needs further work-up.  Patient will follow-up with me though again in 3 days and will make sure that patient can start to increase work again.

## 2018-09-06 NOTE — Patient Instructions (Signed)
Good to see you  Fish oil 1 gram daily  Vitamin D 2000 IU daily  CoQ10 200mg  daily  Likely concussion and should get better fast  Stay hydrated If headaches gets bad 3 ibuprofen 3 times a day for 3 days  If things gets worse go to emergency room again  See me again in 3 days
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
## 2018-09-06 NOTE — Telephone Encounter (Signed)
Spoke with patient. He hit his head on a dock on 08/31/2018 while at work. Patient did suffer LOC. Was seen by UC. Since injury has been having intense headaches, dizziness and blurred vision. Has not returned to work. Is on schedule for later this morning.

## 2018-09-08 NOTE — Progress Notes (Signed)
Subjective:   I, Andrew Frederick, am serving as a scribe for Dr. Hulan Saas.   Chief Complaint: Andrew Frederick., DOB: [birth year], is a 23 y.o. male who presents for head injury. Continues to have headaches daily. They have lasted all day yesterday and today. Is having light sensitivity today as well. Has been out of work. Is able to watch tv without issues. Is not able to play video games without an increase in his symptoms. Has tried to use IBU for his headaches but he states that it has had no effect on his symptoms.    Injury date : 08/31/2018 Visit #: 2  Previous imagine.   History of Present Illness:    Concussion Self-Reported Symptom Score Symptoms rated on a scale 1-6, in last 24 hours  Headache: 6  Nausea: 0  Vomiting:0  Balance Difficulty: 4  Dizziness: 3  Fatigue: 0  Trouble Falling Asleep: 6  Sleep More Than Usual: 0  Sleep Less Than Usual: 5  Daytime Drowsiness: 4  Photophobia: 6  Phonophobia: 0  Irritability: 4  Sadness:0  Nervousness:0  Feeling More Emotional: 0  Numbness or Tingling: 0  Feeling Slowed Down: 4  Feeling Mentally Foggy: 0  Difficulty Concentrating: 0  Difficulty Remembering:0  Visual Problems: 3 blurriness bilateral     Total Symptom Score: 46 Previous Symptom Score: 58  Review of Systems: Pertinent items are noted in HPI.  Review of History: Past Medical History:  Past Medical History:  Diagnosis Date  . Multiple leg contusions 06/05/2015  . Parotiditis 06/04/2017    Past Surgical History:  has no past surgical history on file. Family History: family history includes Diabetes in his father; Hyperlipidemia in his father. no family history of autoimmune Social History:  reports that he has never smoked. He has never used smokeless tobacco. He reports that he does not drink alcohol or use drugs. Current Medications: has a current medication list which includes the following prescription(s): trazodone. Allergies: has No Known  Allergies.  Objective:    Physical Examination Vitals:   09/09/18 0948  BP: 102/84  Pulse: 78  SpO2: 98%   General: No apparent distress alert and oriented x3 mood and affect normal, dressed appropriately.  Patient does appear to be anxious HEENT: Pupils equal, extraocular movements intact very mild nystagmus still noted.  Poor dental hygiene Respiratory: Patient's speak in full sentences and does not appear short of breath  Cardiovascular: No lower extremity edema, non tender, no erythema  Skin: Warm dry intact with no signs of infection or rash on extremities or on axial skeleton.  Abdomen: Soft nontender  Neuro: Cranial nerves II through XII are intact, neurovascularly intact in all extremities with 2+ DTRs and 2+ pulses.  Lymph: No lymphadenopathy of posterior or anterior cervical chain or axillae bilaterally.  Gait normal with good balance and coordination.  MSK:  Non tender with full range of motion and good stability and symmetric strength and tone of shoulders, elbows, wrist,  knee and ankles bilaterally.  Psychiatric: Oriented X3, intact recent and remote memory, judgement and insight, normal mood and affect  Concussion testing performed today:

## 2018-09-09 ENCOUNTER — Ambulatory Visit: Payer: 59 | Admitting: Family Medicine

## 2018-09-09 ENCOUNTER — Encounter: Payer: Self-pay | Admitting: Family Medicine

## 2018-09-09 DIAGNOSIS — S060X1D Concussion with loss of consciousness of 30 minutes or less, subsequent encounter: Secondary | ICD-10-CM

## 2018-09-09 MED ORDER — TRAZODONE HCL 50 MG PO TABS: 25.0000 mg | ORAL_TABLET | Freq: Every evening | ORAL | 3 refills | Status: AC | PRN

## 2018-09-09 NOTE — Patient Instructions (Signed)
Good to see you   Vitamins Fish oil 1 gram daily  Vitamin D 2000 IU daily  CoQ10 200mg  daily   Trazadone at night to help with sleep  We will get you back to part-time next week and see how you do  See me again in 2 weeks

## 2018-09-09 NOTE — Assessment & Plan Note (Signed)
Improvement noted.  Patient symptom score is still somewhat elevated though.  We discussed with patient about doing the vitamin supplementation which patient was noncompliant on.  Patient is going to start increasing work starting next week.  Patient given some exercise prescription to increase activity.  Follow-up again in 2 weeks to hopefully fully release

## 2018-09-23 ENCOUNTER — Ambulatory Visit (INDEPENDENT_AMBULATORY_CARE_PROVIDER_SITE_OTHER): Payer: 59 | Admitting: Family Medicine

## 2018-09-23 ENCOUNTER — Encounter: Payer: Self-pay | Admitting: Family Medicine

## 2018-09-23 DIAGNOSIS — S060X1D Concussion with loss of consciousness of 30 minutes or less, subsequent encounter: Secondary | ICD-10-CM

## 2018-09-23 NOTE — Patient Instructions (Signed)
Good to see you  You are doing great  Stay active Maybe continue vitamin D See me when you need me

## 2018-09-23 NOTE — Assessment & Plan Note (Signed)
Complete resolved at this time.  No significant change in management.  Patient will be fully released at this time and is at maximal medical improvement.  Will follow-up with me on an as-needed basis.

## 2018-09-23 NOTE — Progress Notes (Signed)
Subjective:   I, Andrew Frederick, am serving as a scribe for Dr. Hulan Saas.  Chief Complaint: Andrew Prime., DOB: 1996/02/05, is a 22 y.o. male who presents for head injury. Patient has been working at both of his jobs without symptoms. He feels that he is back to his norm.  No chief complaint on file.   Injury date :08/31/2018 Visit #: 2   History of Present Illness:    Concussion Self-Reported Symptom Score Symptoms rated on a scale 1-6, in last 24 hours  All symptoms are at zero today Previous Symptom Score: 46  Review of Systems: Pertinent items are noted in HPI.  Review of History: Past Medical History:  Past Medical History:  Diagnosis Date  . Multiple leg contusions 06/05/2015  . Parotiditis 06/04/2017    Past Surgical History:  has no past surgical history on file. Family History: family history includes Diabetes in his father; Hyperlipidemia in his father. no family history of autoimmune Social History:  reports that he has never smoked. He has never used smokeless tobacco. He reports that he does not drink alcohol or use drugs. Current Medications: has a current medication list which includes the following prescription(s): trazodone. Allergies: has No Known Allergies.  Objective:    Physical Examination Vitals:   09/23/18 0917  BP: 110/84  Pulse: 80  SpO2: 98%   General: No apparent distress alert and oriented x3 mood and affect normal, dressed appropriately.  HEENT: Pupils equal, extraocular movements intact  Respiratory: Patient's speak in full sentences and does not appear short of breath  Cardiovascular: No lower extremity edema, non tender, no erythema  Skin: Warm dry intact with no signs of infection or rash on extremities or on axial skeleton.  Abdomen: Soft nontender  Neuro: Cranial nerves II through XII are intact, neurovascularly intact in all extremities with 2+ DTRs and 2+ pulses.  Lymph: No lymphadenopathy of posterior or anterior  cervical chain or axillae bilaterally.  Gait normal with good balance and coordination.  MSK:  Non tender with full range of motion and good stability and symmetric strength and tone of shoulders, elbows, wrist,  knee and ankles bilaterally.  Psychiatric: Oriented X3, intact recent and remote memory, judgement and insight, normal mood and affect  Concussion testing performed today:  Plan:   Action/Discussion: Reviewed diagnosis, management options, expected outcomes, and the reasons for scheduled and emergent follow-up. Questions were adequately answered. Patient expressed verbal understanding and agreement with the following plan.      After Visit Summary printed out and provided to patient as appropriate.   The above documentation has been reviewed and is accurate and complete Lyndal Pulley, DO       Note: This dictation was prepared with Dragon dictation along with smaller phrase technology. Any transcriptional errors that result from this process are unintentional.

## 2018-10-11 ENCOUNTER — Emergency Department
Admission: EM | Admit: 2018-10-11 | Discharge: 2018-10-11 | Disposition: A | Discharge status: Home / Self Care | Payer: 59 | Attending: Emergency Medicine | Admitting: Emergency Medicine

## 2018-10-11 ENCOUNTER — Encounter: Payer: Self-pay | Admitting: Emergency Medicine

## 2018-10-11 ENCOUNTER — Other Ambulatory Visit: Payer: Self-pay

## 2018-10-11 DIAGNOSIS — J069 Acute upper respiratory infection, unspecified: Secondary | ICD-10-CM

## 2018-10-11 DIAGNOSIS — B9789 Other viral agents as the cause of diseases classified elsewhere: Secondary | ICD-10-CM

## 2018-10-11 DIAGNOSIS — R05 Cough: Secondary | ICD-10-CM | POA: Diagnosis present

## 2018-10-11 NOTE — Discharge Instructions (Signed)
Person Under Monitoring Name: Andrew Frederick.  Location: [address] [place] [address]   Infection Prevention Recommendations for Individuals Confirmed to have, or Being Evaluated for, 2019 Novel Coronavirus (COVID-19) Infection Who Receive Care at Home  Individuals who are confirmed to have, or are being evaluated for, COVID-19 should follow the prevention steps below until a healthcare provider or local or state health department says they can return to normal activities.  Stay home except to get medical care You should restrict activities outside your home, except for getting medical care. Do not go to work, school, or public areas, and do not use public transportation or taxis.  Call ahead before visiting your doctor Before your medical appointment, call the healthcare provider and tell them that you have, or are being evaluated for, COVID-19 infection. This will help the healthcare providers office take steps to keep other people from getting infected. Ask your healthcare provider to call the local or state health department.  Monitor your symptoms Seek prompt medical attention if your illness is worsening (e.g., difficulty breathing). Before going to your medical appointment, call the healthcare provider and tell them that you have, or are being evaluated for, COVID-19 infection. Ask your healthcare provider to call the local or state health department.  Wear a facemask You should wear a facemask that covers your nose and mouth when you are in the same room with other people and when you visit a healthcare provider. People who live with or visit you should also wear a facemask while they are in the same room with you.  Separate yourself from other people in your home As much as possible, you should stay in a different room from other people in your home. Also, you should use a separate bathroom, if available.  Avoid sharing household items You should  not share dishes, drinking glasses, cups, eating utensils, towels, bedding, or other items with other people in your home. After using these items, you should wash them thoroughly with soap and water.  Cover your coughs and sneezes Cover your mouth and nose with a tissue when you cough or sneeze, or you can cough or sneeze into your sleeve. Throw used tissues in a lined trash can, and immediately wash your hands with soap and water for at least 20 seconds or use an alcohol-based hand rub.  Wash your [facility] your hands often and thoroughly with soap and water for at least 20 seconds. You can use an alcohol-based hand sanitizer if soap and water are not available and if your hands are not visibly dirty. Avoid touching your eyes, nose, and mouth with unwashed hands.   Prevention Steps for Caregivers and Household Members of Individuals Confirmed to have, or Being Evaluated for, COVID-19 Infection Being Cared for in the Home  If you live with, or provide care at home for, a person confirmed to have, or being evaluated for, COVID-19 infection please follow these guidelines to prevent infection:  Follow healthcare providers instructions Make sure that you understand and can help the patient follow any healthcare provider instructions for all care.  Provide for the patients basic needs You should help the patient with basic needs in the home and provide support for getting groceries, prescriptions, and other personal needs.  Monitor the patients symptoms If they are getting sicker, call his or her medical provider and tell them that the patient has, or is being evaluated for, COVID-19 infection. This will help the  healthcare providers office take steps to keep other people from getting infected. Ask the healthcare provider to call the local or state health department.  Limit the number of people who have contact with the patient If possible, have only one caregiver for the  patient. Other household members should stay in another home or place of residence. If this is not possible, they should stay in another room, or be separated from the patient as much as possible. Use a separate bathroom, if available. Restrict visitors who do not have an essential need to be in the home.  Keep older adults, very young children, and other sick people away from the patient Keep older adults, very young children, and those who have compromised immune systems or chronic health conditions away from the patient. This includes people with chronic heart, lung, or kidney conditions, diabetes, and cancer.  Ensure good ventilation Make sure that shared spaces in the home have good air flow, such as from an air conditioner or an opened window, weather permitting.  Wash your hands often Wash your hands often and thoroughly with soap and water for at least 20 seconds. You can use an alcohol based hand sanitizer if soap and water are not available and if your hands are not visibly dirty. Avoid touching your eyes, nose, and mouth with unwashed hands. Use disposable paper towels to dry your hands. If not available, use dedicated cloth towels and replace them when they become wet.  Wear a facemask and gloves Wear a disposable facemask at all times in the room and gloves when you touch or have contact with the patients blood, body fluids, and/or secretions or excretions, such as sweat, saliva, sputum, nasal mucus, vomit, urine, or feces.  Ensure the mask fits over your nose and mouth tightly, and do not touch it during use. Throw out disposable facemasks and gloves after using them. Do not reuse. Wash your hands immediately after removing your facemask and gloves. If your personal clothing becomes contaminated, carefully remove clothing and launder. Wash your hands after handling contaminated clothing. Place all used disposable facemasks, gloves, and other waste in a lined container before  disposing them with other household waste. Remove gloves and wash your hands immediately after handling these items.  Do not share dishes, glasses, or other household items with the patient Avoid sharing household items. You should not share dishes, drinking glasses, cups, eating utensils, towels, bedding, or other items with a patient who is confirmed to have, or being evaluated for, COVID-19 infection. After the person uses these items, you should wash them thoroughly with soap and water.  Wash laundry thoroughly Immediately remove and wash clothes or bedding that have blood, body fluids, and/or secretions or excretions, such as sweat, saliva, sputum, nasal mucus, vomit, urine, or feces, on them. Wear gloves when handling laundry from the patient. Read and follow directions on labels of laundry or clothing items and detergent. In general, wash and dry with the warmest temperatures recommended on the label.  Clean all areas the individual has used often Clean all touchable surfaces, such as counters, tabletops, doorknobs, bathroom fixtures, toilets, phones, keyboards, tablets, and bedside tables, every day. Also, clean any surfaces that may have blood, body fluids, and/or secretions or excretions on them. Wear gloves when cleaning surfaces the patient has come in contact with. Use a diluted bleach solution (e.g., dilute bleach with 1 part bleach and 10 parts water) or a household disinfectant with a label that says EPA-registered for coronaviruses. To  make a bleach solution at home, add 1 tablespoon of bleach to 1 quart (4 cups) of water. For a larger supply, add  cup of bleach to 1 gallon (16 cups) of water. Read labels of cleaning products and follow recommendations provided on product labels. Labels contain instructions for safe and effective use of the cleaning product including precautions you should take when applying the product, such as wearing gloves or eye protection and making sure you  have good ventilation during use of the product. Remove gloves and wash hands immediately after cleaning.  Monitor yourself for signs and symptoms of illness Caregivers and household members are considered close contacts, should monitor their health, and will be asked to limit movement outside of the home to the extent possible. Follow the monitoring steps for close contacts listed on the symptom monitoring form.   ? If you have additional questions, contact your local health department or call the epidemiologist on call at [phone] (available 24/7). ? This guidance is subject to change. For the most up-to-date guidance from [facility], please refer to their website: [email or web address]

## 2018-10-11 NOTE — ED Triage Notes (Signed)
Pt arrives POV and ambulatory to triage with cough x 2 days. Pt reports some chest discomfort when he coughs. Pt is in NAD.

## 2018-10-11 NOTE — ED Provider Notes (Signed)
[facility] Emergency Department Provider Note   ____________________________________________    I have reviewed the triage vital signs and the nursing notes.   HISTORY  Chief Complaint Cough     HPI Andrew Frederick. is a 23 y.o. male who presents with complaints of a mild cough.  Patient reports cough is been ongoing for approximately 2 days he denies shortness of breath.  No fevers or chills.  No significant myalgias.  No recent travel.  No calf pain or swelling.  No nausea or vomiting.   Past Medical History:  Diagnosis Date  . Multiple leg contusions 06/05/2015  . Parotiditis 06/04/2017    Patient Active Problem List   Diagnosis Date Noted  . Concussion 09/06/2018  . Suicidal ideation 10/29/2017  . 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 Muscle tension headache 06/04/2017  . 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 No pertinent surgical history.  Prior to Admission medications   Medication Sig Start Date End Date Taking? Authorizing Provider  traZODone (DESYREL) 50 MG tablet Take 0.5-1 tablets (25-50 mg total) by mouth at bedtime as needed for sleep. 09/09/18   Lyndal Pulley, DO     Allergies Patient has no known allergies.  Family History  Problem Relation Age of Onset  . Diabetes Father   . Hyperlipidemia Father     Social History Social History   Tobacco Use  . Smoking status: Never Smoker  . Smokeless tobacco: Never Used  Substance Use Topics  . Alcohol use: No    Alcohol/week: 0.0 standard drinks  . Drug use: No    Review of Systems  Constitutional: No fever/chills Eyes: No visual changes.  ENT: No sore throat. Cardiovascular: Denies chest pain. Respiratory: Cough as above Gastrointestinal: No abdominal pain.  Genitourinary: Negative for dysuria. Musculoskeletal: Negative for back pain. Skin: Negative  for rash. Neurological: Negative for headaches   ____________________________________________   PHYSICAL EXAM:  VITAL SIGNS: ED Triage Vitals  Enc Vitals Group     BP 10/11/18 0641 130/89     Pulse Rate 10/11/18 0641 80     Resp 10/11/18 0641 19     Temp 10/11/18 0641 98.5 F (36.9 C)     Temp Source 10/11/18 0641 Oral     SpO2 10/11/18 0641 100 %     Weight 10/11/18 0640 61.2 kg (135 lb)     Height 10/11/18 0640 1.829 m (6')     Head Circumference --      Peak Flow --      Pain Score 10/11/18 0640 8     Pain Loc --      Pain Edu? --      Excl. in [place]? --     Constitutional: Alert and oriented.  Eyes: Conjunctivae are normal.   Nose: No congestion/rhinnorhea. Mouth/Throat: Mucous membranes are moist.    Cardiovascular: Normal rate, regular rhythm. Grossly normal heart sounds.  Good peripheral circulation. Respiratory: Normal respiratory effort.  No retractions. Lungs CTAB. Gastrointestinal: Soft and nontender. No distention.  Musculoskeletal:   Warm and well perfused Neurologic:  Normal speech and language. No gross focal neurologic deficits are appreciated.  Skin:  Skin is warm, dry and intact. No rash noted. Psychiatric: Mood and affect are normal. Speech and behavior are normal.  ____________________________________________   LABS (all labs ordered are listed, but only abnormal results are displayed)  Labs Reviewed - No  data to display ____________________________________________  EKG  None ____________________________________________  RADIOLOGY  None ____________________________________________   PROCEDURES  Procedure(s) performed: No  Procedures   Critical Care performed: No ____________________________________________   INITIAL IMPRESSION / ASSESSMENT AND PLAN / ED COURSE  Pertinent labs & imaging results that were available during my care of the patient were reviewed by me and considered in my medical decision making (see chart for  details).  Patient well-appearing and in no acute distress.  Quite reassuring exam, normal vital signs, symptoms are consistent with upper respiratory infection likely viral in nature.  No occasion for further work-up this time, recommend follow-up with PCP.  Return precautions discussed    ____________________________________________   FINAL CLINICAL IMPRESSION(S) / ED DIAGNOSES  Final diagnoses:  Viral URI with cough        Note:  This document was prepared using Dragon voice recognition software and may include unintentional dictation errors.   Lavonia Drafts, MD 10/11/18 [phone]

## 2018-10-23 ENCOUNTER — Encounter: Payer: Self-pay | Admitting: Family Medicine

## 2020-01-31 ENCOUNTER — Emergency Department
Admission: EM | Admit: 2020-01-31 | Discharge: 2020-01-31 | Disposition: A | Discharge status: Home / Self Care | Payer: Self-pay | Attending: Emergency Medicine | Admitting: Emergency Medicine

## 2020-01-31 ENCOUNTER — Other Ambulatory Visit: Payer: Self-pay

## 2020-01-31 ENCOUNTER — Emergency Department: Payer: Self-pay

## 2020-01-31 ENCOUNTER — Encounter: Payer: Self-pay | Admitting: Emergency Medicine

## 2020-01-31 DIAGNOSIS — R0789 Other chest pain: Secondary | ICD-10-CM | POA: Insufficient documentation

## 2020-01-31 LAB — BASIC METABOLIC PANEL
Anion gap: 8 (ref 5–15)
BUN: 12 mg/dL (ref 6–20)
CO2: 25 mmol/L (ref 22–32)
Calcium: 8.8 mg/dL — ABNORMAL LOW (ref 8.9–10.3)
Chloride: 106 mmol/L (ref 98–111)
Creatinine, Ser: 0.96 mg/dL (ref 0.61–1.24)
GFR calc Af Amer: >60 mL/min (ref >60)
GFR calc non Af Amer: >60 mL/min (ref >60)
Glucose, Bld: 124 mg/dL — ABNORMAL HIGH (ref 70–99)
Potassium: 3.8 mmol/L (ref 3.5–5.1)
Sodium: 139 mmol/L (ref 135–145)

## 2020-01-31 LAB — CBC
HCT: 40.3 % (ref 39.0–52.0)
Hemoglobin: 13.9 g/dL (ref 13.0–17.0)
MCH: 29.4 pg (ref 26.0–34.0)
MCHC: 34.5 g/dL (ref 30.0–36.0)
MCV: 85.2 fL (ref 80.0–100.0)
Platelets: 231 10*3/uL (ref 150–400)
RBC: 4.73 MIL/uL (ref 4.22–5.81)
RDW: 12.9 % (ref 11.5–15.5)
WBC: 4.5 10*3/uL (ref 4.0–10.5)
nRBC: 0 % (ref 0.0–0.2)

## 2020-01-31 LAB — TROPONIN I (HIGH SENSITIVITY)
Troponin I (High Sensitivity): 3 ng/L (ref <18)
Troponin I (High Sensitivity): 3 ng/L (ref <18)

## 2020-01-31 NOTE — ED Triage Notes (Signed)
Here for mid chest pain and feeling dizzy. Started when got to work today. No medical hx. No history of same.  NAD. VSS

## 2020-01-31 NOTE — ED Notes (Signed)
Pt ambulatory from triage with steady gait, NAD, resps even and unlabored. Pt reports central chest pain that started around 2:00 during a meeting at work. Pt reports he felt dizzy during the episode but the chest pain and dizziness have now subsided.

## 2020-01-31 NOTE — ED Provider Notes (Signed)
[facility] Emergency Department Provider Note   ____________________________________________    I have reviewed the triage vital signs and the nursing notes.   HISTORY  Chief Complaint Chest Pain     HPI Andrew Frederick. is a 24 y.o. male with history as noted below who presents with complaints of chest pain.  Patient reports he had brief episode of central chest pain which he describes as sharp in nature.  He reports it has resolved and he feels well now.  He denies fevers or chills or cough.  No pleurisy.  No shortness of breath.  No nausea or vomiting or diaphoresis.  No calf pain or swelling.  Is not take anything for this.  No history of heart disease  Past Medical History:  Diagnosis Date  . Multiple leg contusions 06/05/2015  . Parotiditis 06/04/2017    Patient Active Problem List   Diagnosis Date Noted  . Concussion 09/06/2018  . Suicidal ideation 10/29/2017  . Severe recurrent major depression without psychotic features ([place]) 10/29/2017  . Muscle tension headache 06/04/2017  . 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 No pertinent surgical history.  Prior to Admission medications   Medication Sig Start Date End Date Taking? Authorizing Provider  traZODone (DESYREL) 50 MG tablet Take 0.5-1 tablets (25-50 mg total) by mouth at bedtime as needed for sleep. 09/09/18   Lyndal Pulley, DO     Allergies Patient has no known allergies.  Family History  Problem Relation Age of Onset  . Diabetes Father   . Hyperlipidemia Father     Social History Social History   Tobacco Use  . Smoking status: Never Smoker  . Smokeless tobacco: Never Used  Substance Use Topics  . Alcohol use: No    Alcohol/week: 0.0 standard drinks  . Drug use: No    Review of Systems  Constitutional: No fever/chills Eyes: No visual changes.  ENT: No sore throat. Cardiovascular: As  above Respiratory: Denies shortness of breath. Gastrointestinal: No abdominal pain.  No nausea, no vomiting.   Genitourinary: Negative for frequent urination Musculoskeletal: Negative for back pain. Skin: Negative for rash. Neurological: Negative for headaches   ____________________________________________   PHYSICAL EXAM:  VITAL SIGNS: ED Triage Vitals [01/31/20 1457]  Enc Vitals Group     BP 123/73     Pulse Rate 86     Resp 18     Temp 98.6 F (37 C)     Temp Source Oral     SpO2 98 %     Weight      Height      Head Circumference      Peak Flow      Pain Score 5     Pain Loc      Pain Edu?      Excl. in [place]?     Constitutional: Alert and oriented.   Nose: No congestion/rhinnorhea. Mouth/Throat: Mucous membranes are moist.    Cardiovascular: Normal rate, regular rhythm. Grossly normal heart sounds.  Good peripheral circulation.  No tenderness palpation of the chest wall Respiratory: Normal respiratory effort.  No retractions. Lungs CTAB. Gastrointestinal: Soft and nontender. No distention.  No CVA tenderness.  Musculoskeletal: No lower extremity tenderness nor edema.  Warm and well perfused Neurologic:  Normal speech and language. No gross focal neurologic deficits are appreciated.  Skin:  Skin is warm, dry and intact. No rash noted. Psychiatric: Mood and affect  are normal. Speech and behavior are normal.  ____________________________________________   LABS (all labs ordered are listed, but only abnormal results are displayed)  Labs Reviewed  BASIC METABOLIC PANEL - Abnormal; Notable for the following components:      Result Value   Glucose, Bld 124 (*)    Calcium 8.8 (*)    All other components within normal limits  CBC  TROPONIN I (HIGH SENSITIVITY)  TROPONIN I (HIGH SENSITIVITY)   ____________________________________________  EKG  ED ECG REPORT I, Lavonia Drafts, the attending physician, personally viewed and interpreted this ECG.  Date:  01/31/2020  Rhythm: normal sinus rhythm QRS Axis: normal Intervals: normal ST/T Wave abnormalities: normal Narrative Interpretation: no evidence of acute ischemia  ____________________________________________  RADIOLOGY  Chest x-ray reviewed by me, no infiltrate or effusion or edema ____________________________________________   PROCEDURES  Procedure(s) performed: No  Procedures   Critical Care performed: No ____________________________________________   INITIAL IMPRESSION / ASSESSMENT AND PLAN / ED COURSE  Pertinent labs & imaging results that were available during my care of the patient were reviewed by me and considered in my medical decision making (see chart for details).  Patient well-appearing and in no acute distress.  Chest pain resolved prior to arrival.  Given his age he is low risk for ACS.  Differential includes musculoskeletal pain, pleurisy, nonspecific chest pain, GERD  Initial troponin is normal, EKG is quite reassuring.  White blood cell count is normal, chemistries are normal.  Patient remains asymptomatic.  Second troponin is normal.  Appropriate for discharge with outpatient follow-up, return precautions discussed    ____________________________________________   FINAL CLINICAL IMPRESSION(S) / ED DIAGNOSES  Final diagnoses:  Atypical chest pain        Note:  This document was prepared using Dragon voice recognition software and may include unintentional dictation errors.   Lavonia Drafts, MD 01/31/20 2136

## 2022-06-16 ENCOUNTER — Encounter: Payer: Self-pay | Admitting: Emergency Medicine

## 2022-06-16 ENCOUNTER — Ambulatory Visit
Admission: EM | Admit: 2022-06-16 | Discharge: 2022-06-16 | Disposition: A | Discharge status: Home / Self Care | Payer: Self-pay | Attending: Urgent Care | Admitting: Urgent Care

## 2022-06-16 DIAGNOSIS — Z79899 Other long term (current) drug therapy: Secondary | ICD-10-CM | POA: Insufficient documentation

## 2022-06-16 DIAGNOSIS — J101 Influenza due to other identified influenza virus with other respiratory manifestations: Secondary | ICD-10-CM | POA: Insufficient documentation

## 2022-06-16 DIAGNOSIS — Z1152 Encounter for screening for COVID-19: Secondary | ICD-10-CM | POA: Insufficient documentation

## 2022-06-16 LAB — RESP PANEL BY RT-PCR (FLU A&B, COVID) ARPGX2
Influenza A by PCR: POSITIVE — AB
Influenza B by PCR: NEGATIVE
SARS Coronavirus 2 by RT PCR: NEGATIVE

## 2022-06-16 MED ORDER — OSELTAMIVIR PHOSPHATE 75 MG PO CAPS: 75.0000 mg | ORAL_CAPSULE | Freq: Two times a day (BID) | ORAL | 0 refills | Status: AC

## 2022-06-16 NOTE — ED Provider Notes (Signed)
[facility]    CSN: [id number] Arrival date & time: 06/16/22  1649      History   Chief Complaint Chief Complaint  Patient presents with   Cough   Generalized Body Aches   Headache   Sore Throat   Fever    HPI Andrew Frederick. is a 26 y.o. male.    Cough Associated symptoms: fever and headaches   Headache Associated symptoms: cough and fever   Sore Throat Associated symptoms include headaches.  Fever Associated symptoms: cough and headaches     Patient presents to urgent care with complaint of cough, body aches, fever, headache, sore throat, vomiting x 2 days.  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 No pertinent surgical history.     Home Medications    Prior to Admission medications   Medication Sig Start Date End Date Taking? Authorizing Provider  traZODone (DESYREL) 50 MG tablet Take 0.5-1 tablets (25-50 mg total) by mouth at bedtime as needed for sleep. 09/09/18   Lyndal Pulley, DO    Family History Family History  Problem Relation Age of Onset   Diabetes Father    Hyperlipidemia Father     Social History Social History   Tobacco Use   Smoking status: Never   Smokeless tobacco: Never  Vaping Use   Vaping Use: Never used  Substance Use Topics   Alcohol use: No    Alcohol/week: 0.0 standard drinks of alcohol   Drug use: No     Allergies   Patient has no known allergies.   Review of Systems Review of Systems  Constitutional:  Positive for fever.  Respiratory:  Positive for cough.   Neurological:  Positive for headaches.      Physical Exam Triage Vital Signs ED Triage Vitals  Enc Vitals Group     BP 06/16/22 1743 114/68     Pulse Rate 06/16/22 1743 91     Resp 06/16/22 1743 16     Temp 06/16/22 1743 99.4 F (37.4 C)     Temp Source 06/16/22 1743 Oral     SpO2 06/16/22 1743 99 %     Weight --      Height --      Head Circumference --      Peak Flow --      Pain Score 06/16/22 1741 10     Pain Loc --      Pain Edu? --      Excl. in [place]? --    No data found.  Updated Vital Signs BP 114/68 (BP Location: Left Arm)   Pulse 91   Temp 99.4 F (37.4 C) (Oral)   Resp 16   SpO2 99%   Visual Acuity Right Eye Distance:   Left Eye Distance:   Bilateral Distance:    Right Eye Near:   Left Eye Near:    Bilateral Near:     Physical Exam Vitals reviewed.  Constitutional:      Appearance: He is well-developed.  Cardiovascular:     Rate and Rhythm: Normal rate and regular rhythm.  Pulmonary:  Effort: Pulmonary effort is normal.     Breath sounds: Normal breath sounds.  Abdominal:     General: Bowel sounds are normal.     Palpations: Abdomen is soft.  Neurological:     Mental Status: He is alert.  Psychiatric:        Mood and Affect: Mood normal.        Behavior: Behavior normal.      UC Treatments / Results  Labs (all labs ordered are listed, but only abnormal results are displayed) Labs Reviewed  RESP PANEL BY RT-PCR (FLU A&B, COVID) ARPGX2 - Abnormal; Notable for the following components:      Result Value   Influenza A by PCR POSITIVE (*)    All other components within normal limits    EKG   Radiology No results found.  Procedures Procedures (including critical care time)  Medications Ordered in UC Medications - No data to display  Initial Impression / Assessment and Plan / UC Course  I have reviewed the triage vital signs and the nursing notes.  Pertinent labs & imaging results that were available during my care of the patient were reviewed by me and  considered in my medical decision making (see chart for details).   Patient is afebrile here without recent antipyretics. Satting well on room air. Overall is ill appearing, though well hydrated and without respiratory distress. Pulmonary exam is unremarkable.  Lungs CTA B.  Moderate cough is present.  Suspect viral etiology.  Respiratory swab is positive for influenza and will offer Tamiflu given his symptoms are still within 2 days.    Final Clinical Impressions(s) / UC Diagnoses   Final diagnoses:  None   Discharge Instructions   None    ED Prescriptions   None    PDMP not reviewed this encounter.   Rose Phi, [place] 06/16/22 1842

## 2022-06-16 NOTE — Discharge Instructions (Addendum)
I have prescribed Tamiflu, an antiviral medication for influenza A.  You may also use over-the-counter medications to control your other symptoms.  Follow up here or with your primary care provider if your symptoms are worsening or not improving.

## 2022-06-16 NOTE — ED Triage Notes (Signed)
Pt presents with cough, bodyaches, fever, headache, sore throat and vomiting x 2 days.
